# Patient Record
Sex: Male | Born: 2000 | Race: White | Hispanic: No | Marital: Married | Smoking: Current some day smoker
Health system: Southern US, Community
[De-identification: ages and names within clinical notes are randomized; demographics above are authoritative.]

## PROBLEM LIST (undated history)

## (undated) DIAGNOSIS — J189 Pneumonia, unspecified organism: Secondary | ICD-10-CM

## (undated) DIAGNOSIS — F509 Eating disorder, unspecified: Secondary | ICD-10-CM

## (undated) DIAGNOSIS — F419 Anxiety disorder, unspecified: Secondary | ICD-10-CM

## (undated) DIAGNOSIS — F32A Depression, unspecified: Secondary | ICD-10-CM

## (undated) DIAGNOSIS — G4700 Insomnia, unspecified: Secondary | ICD-10-CM

## (undated) DIAGNOSIS — IMO0002 Reserved for concepts with insufficient information to code with codable children: Secondary | ICD-10-CM

## (undated) DIAGNOSIS — F329 Major depressive disorder, single episode, unspecified: Secondary | ICD-10-CM

---
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
## 2019-09-01 IMAGING — DX DG ABDOMEN ACUTE W/ 1V CHEST
3 series · 3 of 3 positions shown · non-contrast
Comparison: None.

CLINICAL DATA: Acute onset of mid abdominal pain.

EXAM:
DG ABDOMEN ACUTE W/ 1V CHEST

[chest pa]
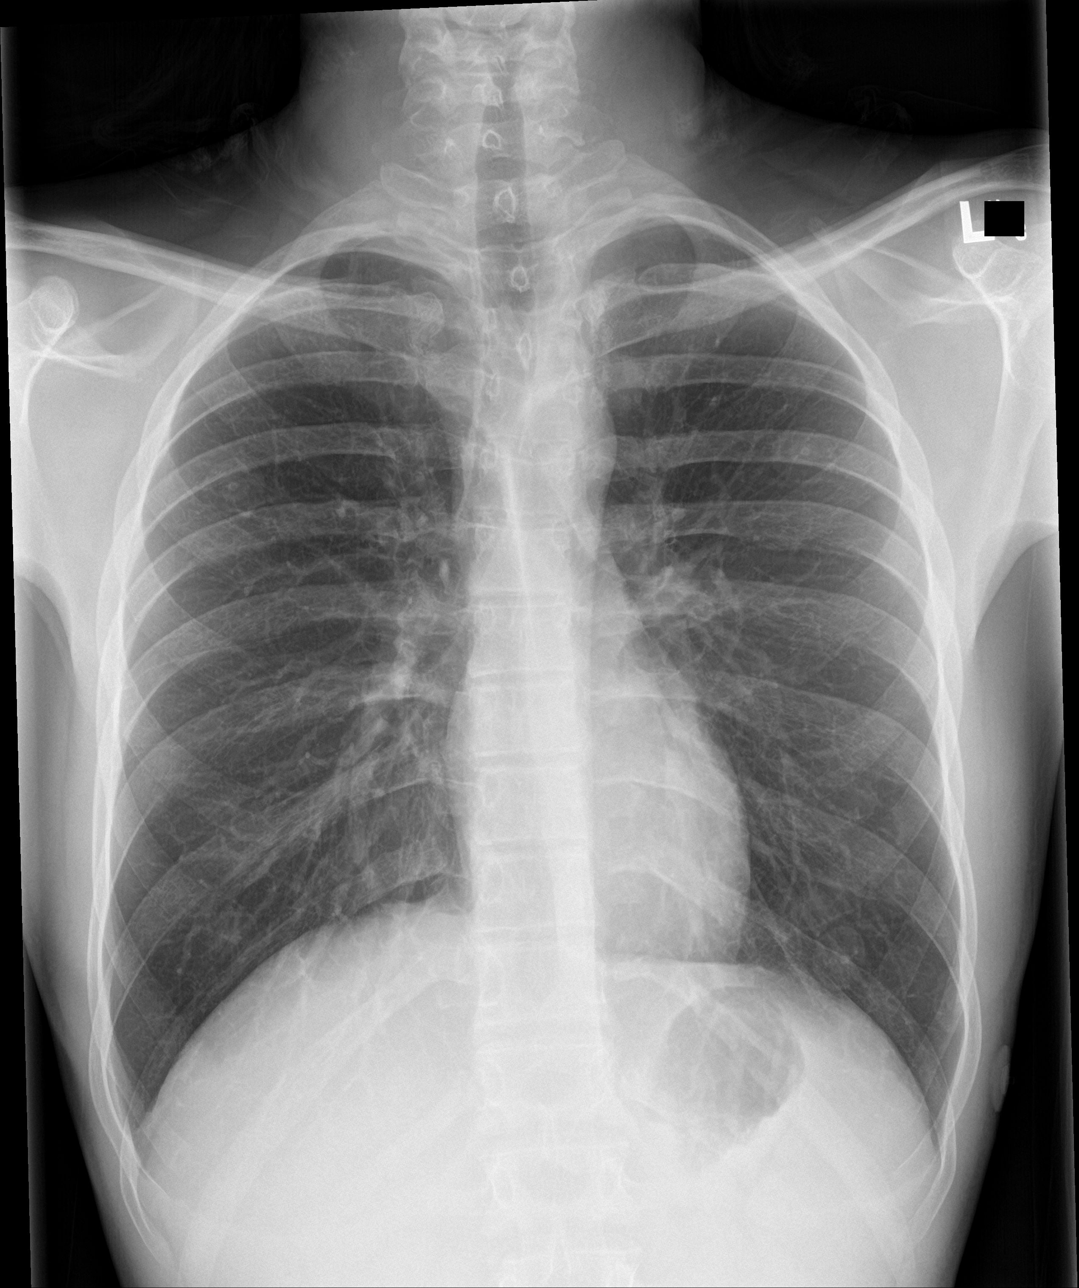

[abdomen erect]
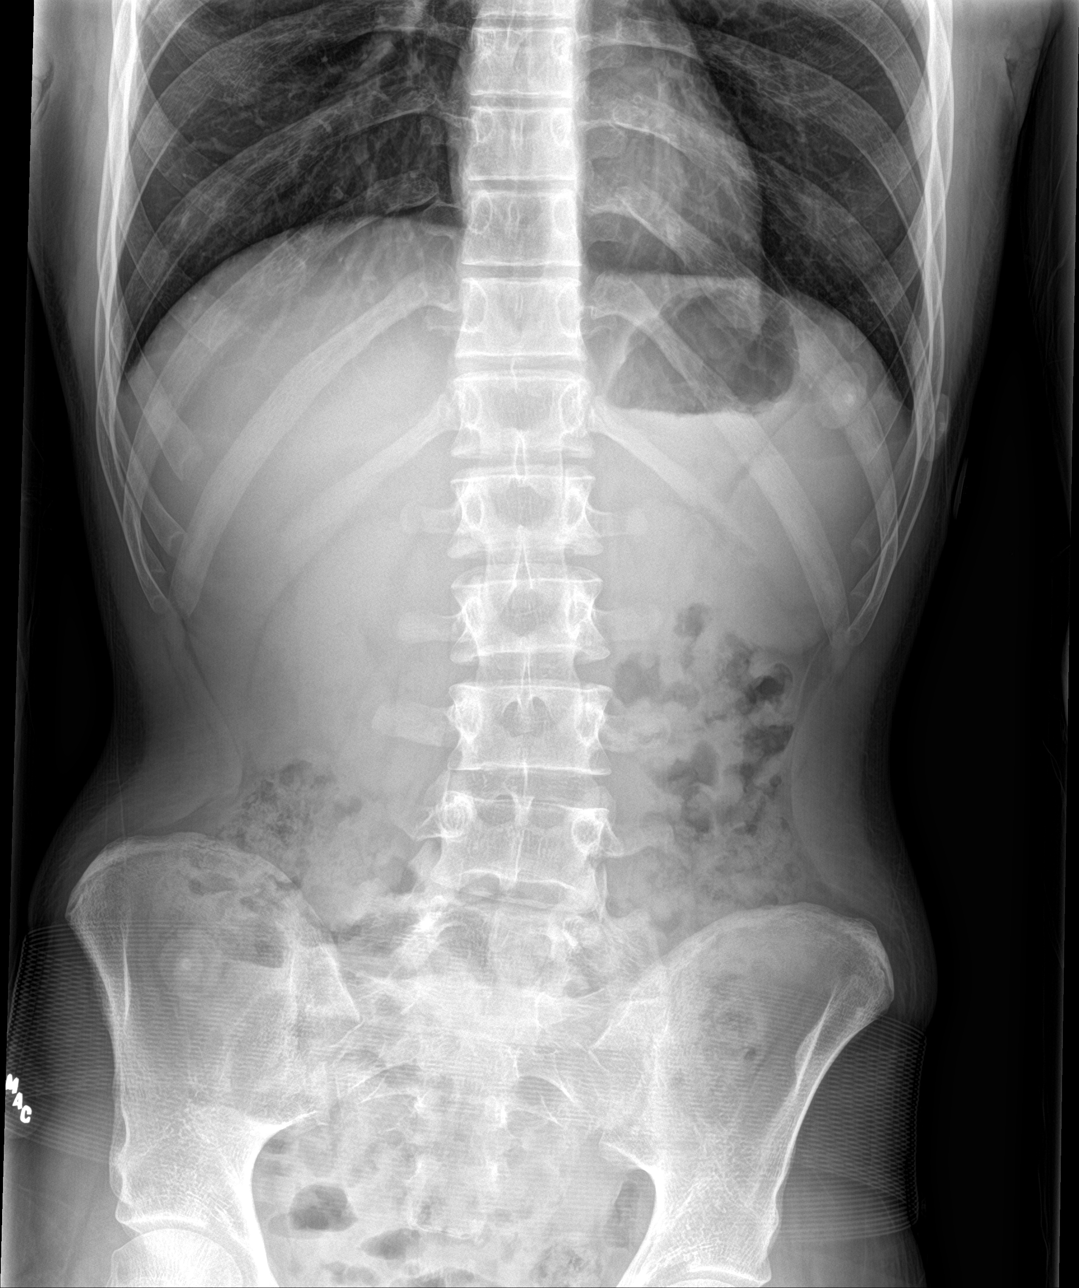

[abdomen supine]
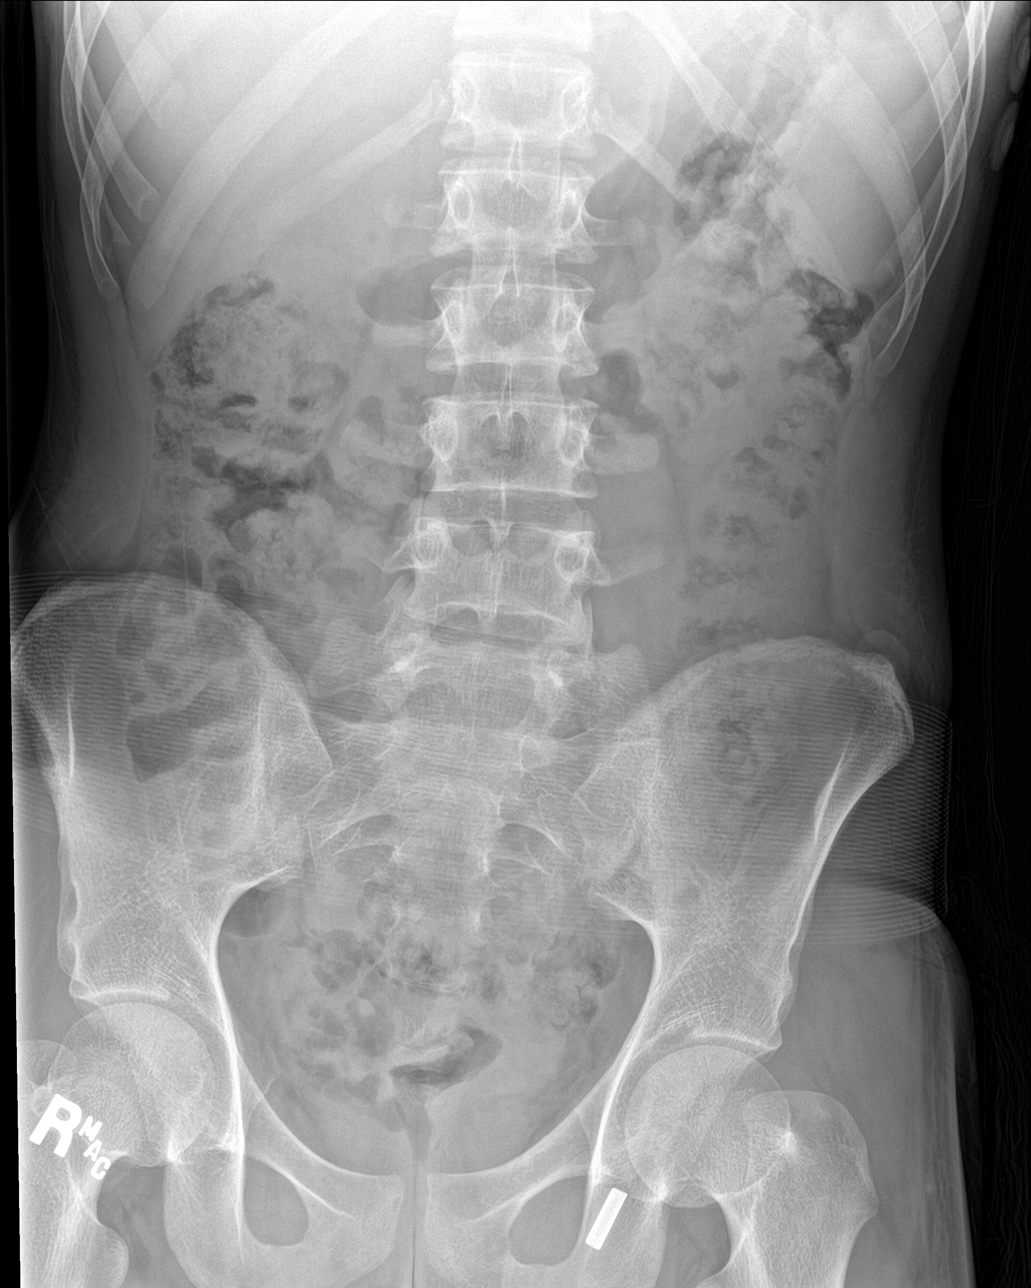

[3 of 3 positions shown; findings below may reference images not displayed]

FINDINGS: The lungs are well-aerated and clear. There is no evidence of focal
opacification, pleural effusion or pneumothorax. The
cardiomediastinal silhouette is within normal limits.

The visualized bowel gas pattern is unremarkable. Scattered stool
and air are seen within the colon; there is no evidence of small
bowel dilatation to suggest obstruction. No free intra-abdominal air
is identified on the provided upright view.

No acute osseous abnormalities are seen; the sacroiliac joints are
unremarkable in appearance.
IMPRESSION: 1. Unremarkable bowel gas pattern; no free intra-abdominal air seen.
Moderate amount of stool noted in the colon.
2. No acute cardiopulmonary process seen.

## 2020-07-18 ENCOUNTER — Other Ambulatory Visit: Payer: Self-pay

## 2020-07-18 ENCOUNTER — Emergency Department (HOSPITAL_BASED_OUTPATIENT_CLINIC_OR_DEPARTMENT_OTHER)
Admission: EM | Admit: 2020-07-18 | Discharge: 2020-07-18 | Disposition: A | Discharge status: Home / Self Care | Payer: Worker's Compensation | Attending: Emergency Medicine | Admitting: Emergency Medicine

## 2020-07-18 ENCOUNTER — Encounter (HOSPITAL_BASED_OUTPATIENT_CLINIC_OR_DEPARTMENT_OTHER): Payer: Self-pay | Admitting: *Deleted

## 2020-07-18 DIAGNOSIS — Y92090 Kitchen in other non-institutional residence as the place of occurrence of the external cause: Secondary | ICD-10-CM | POA: Insufficient documentation

## 2020-07-18 DIAGNOSIS — Y93G3 Activity, cooking and baking: Secondary | ICD-10-CM | POA: Diagnosis not present

## 2020-07-18 DIAGNOSIS — W260XXA Contact with knife, initial encounter: Secondary | ICD-10-CM | POA: Diagnosis not present

## 2020-07-18 DIAGNOSIS — S61315A Laceration without foreign body of left ring finger with damage to nail, initial encounter: Secondary | ICD-10-CM

## 2020-07-18 DIAGNOSIS — Z23 Encounter for immunization: Secondary | ICD-10-CM | POA: Diagnosis not present

## 2020-07-18 DIAGNOSIS — F17200 Nicotine dependence, unspecified, uncomplicated: Secondary | ICD-10-CM | POA: Diagnosis not present

## 2020-07-18 DIAGNOSIS — S61215A Laceration without foreign body of left ring finger without damage to nail, initial encounter: Secondary | ICD-10-CM | POA: Diagnosis present

## 2020-07-18 MED ORDER — LIDOCAINE HCL (PF) 1 % IJ SOLN
10.0000 mL | Freq: Once | INTRAMUSCULAR | Status: AC
  Administered 2020-07-18: 10 mL
  Filled 2020-07-18: qty 10

## 2020-07-18 MED ORDER — TETANUS-DIPHTH-ACELL PERTUSSIS 5-2.5-18.5 LF-MCG/0.5 IM SUSY
0.5000 mL | PREFILLED_SYRINGE | Freq: Once | INTRAMUSCULAR | Status: AC
  Administered 2020-07-18: 0.5 mL via INTRAMUSCULAR
  Filled 2020-07-18: qty 0.5

## 2020-07-18 NOTE — ED Triage Notes (Signed)
Left index finger lac by kitchen knife x 20 mins ago

## 2020-07-18 NOTE — Discharge Instructions (Signed)
Please read and follow all provided instructions.  Your diagnoses today include:  1. Laceration of left ring finger without foreign body with damage to nail, initial encounter     Tests performed today include:  Vital signs. See below for your results today.   Medications prescribed:   None  Take any prescribed medications only as directed.   Home care instructions:  Follow any educational materials and wound care instructions contained in this packet.   Keep affected area above the level of your heart when possible to minimize swelling. Wash area gently twice a day with warm soapy water. Do not apply alcohol or hydrogen peroxide. Cover the area if it draining or weeping.   Follow-up instructions: Suture Removal: Return to the Emergency Department or see your primary care care doctor in 10 days for a recheck of your wound and removal of your sutures or staples.    Return instructions:  Return to the Emergency Department if you have:  Fever  Worsening pain  Worsening swelling of the wound  Pus draining from the wound  Redness of the skin that moves away from the wound, especially if it streaks away from the affected area   Any other emergent concerns  Your vital signs today were: BP (!) 119/47   Pulse 68   Temp 98.3 F (36.8 C) (Oral)   Resp 16   Ht 5\' 9"  (1.753 m)   Wt 59 kg   SpO2 99%   BMI 19.20 kg/m  If your blood pressure (BP) was elevated above 135/85 this visit, please have this repeated by your doctor within one month. --------------

## 2020-07-18 NOTE — ED Notes (Signed)
Laceration to left ring finger, cut with a knife while slicing , bleeding controlled.

## 2020-07-18 NOTE — ED Provider Notes (Signed)
[facility] EMERGENCY DEPARTMENT Provider Note   CSN: [id number] Arrival date & time: 07/18/20  1628     History Chief Complaint  Patient presents with  . Laceration    Jerome Collins is a 20 y.o. male.  Patient presents to the emergency department for evaluation of a laceration sustained just prior to arrival.  Patient was at work cutting chicken with a knife when he cut the dorsal aspect of his left index finger, distal to the DIP joint.  He applied a paper towel.  No other treatments prior to arrival.  He reports pain in the area, worse with movement.  No numbness or tingling reported.  No other injuries.  Last tetanus booster in childhood.        Past Medical History:  Diagnosis Date  . Anxiety    copes by being more social  . Depression   . Eating disorder    anorexia, bulemia  . Pneumonia    18 months of age  . Self-harm    last thought of self harm 2 days ago r/t eating, no being able to purge, step dad    Patient Active Problem List   Diagnosis Date Noted  . Eating disorder 11/04/2015  . Dizziness 11/04/2015  . 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 No pertinent surgical history.     Family History  Problem Relation Age of Onset  . Mental illness Mother        depression, anxiety  . Cancer Maternal Grandmother        leukemia, breast  . Cancer Paternal Grandfather        prostate    Social History   Tobacco Use  . Smoking status: Current Some Day Smoker  . Smokeless tobacco: Never Used  . Tobacco comment: vapes - no nicotine - every other day  Substance Use Topics  . Alcohol use: Yes    Comment: mikes hard lemonade w/ shot of vodka  . Drug use: Yes    Comment: LSD    Home Medications Prior to Admission medications   Medication Sig Start Date End Date Taking? Authorizing Provider  polyethylene glycol powder (MIRALAX) powder Take 17 g by mouth daily. 06/30/17    Palumbo, April, MD    Allergies    Patient has no known allergies.  Review of Systems   Review of Systems  Constitutional: Negative for activity change.  Musculoskeletal: Negative for gait problem and joint swelling.  Skin: Positive for wound.  Neurological: Negative for weakness and numbness.    Physical Exam Updated Vital Signs BP (!) 119/47   Pulse 68   Temp 98.3 F (36.8 C) (Oral)   Resp 16   Ht 5\' 9"  (1.753 m)   Wt 59 kg   SpO2 99%   BMI 19.20 kg/m   Physical Exam Vitals and nursing note reviewed.  Constitutional:      Appearance: He is well-developed.  HENT:     Head: Normocephalic and atraumatic.  Eyes:     Conjunctiva/sclera: Conjunctivae normal.  Pulmonary:     Effort: No respiratory distress.  Musculoskeletal:     Cervical back: Normal range of motion and neck supple.     Comments: Left index finger: Full active extension at the DIP, PIP, MCP joints.  Normal range of motion of the hand and patient is able to make a fist without difficulty.  Skin:    General: Skin is warm and dry.  Comments: L index finger: There is a 1cm laceration, dorsal aspect, couple millimeters proximal to the nail fold.  Wound base explored with tourniquet in place.  I can visualize the proximal end of the nail plate.  The wound extends to approximately depth of nail plate.  I do not visualize any foreign bodies or tendon injury.  Wound base is clean.  Mild venous oozing prior to tourniquet placement.  Neurological:     Mental Status: He is alert.     ED Results / Procedures / Treatments   Labs (all labs ordered are listed, but only abnormal results are displayed) Labs Reviewed - No data to display  EKG None  Radiology No results found.  Procedures .Marland KitchenLaceration Repair  Date/Time: 07/18/2020 6:02 PM Performed by: Renne Crigler, PA-C Authorized by: Renne Crigler, PA-C   Consent:    Consent obtained:  Verbal   Consent given by:  Patient   Risks discussed:   Infection, pain, tendon damage and poor cosmetic result   Alternatives discussed:  No treatment Universal protocol:    Patient identity confirmed:  Verbally with patient, arm band and provided demographic data Anesthesia:    Anesthesia method:  Local infiltration   Local anesthetic:  Lidocaine 1% w/o epi Laceration details:    Location:  Finger   Finger location:  L ring finger   Length (cm):  1 Pre-procedure details:    Preparation:  Patient was prepped and draped in usual sterile fashion Exploration:    Wound exploration: wound explored through full range of motion and entire depth of wound visualized     Wound extent: no foreign bodies/material noted and no tendon damage noted     Contaminated: no   Treatment:    Irrigation solution:  Sterile saline   Irrigation volume:  500cc   Irrigation method:  Pressure wash   Debridement:  None Skin repair:    Repair method:  Sutures   Suture size:  5-0   Suture material:  Nylon   Suture technique:  Simple interrupted   Number of sutures:  3 Approximation:    Approximation:  Close Post-procedure details:    Dressing:  Open (no dressing)   Procedure completion:  Tolerated well, no immediate complications     Medications Ordered in ED Medications  lidocaine (PF) (XYLOCAINE) 1 % injection 10 mL (has no administration in time range)  Tdap (BOOSTRIX) injection 0.5 mL (has no administration in time range)    ED Course  I have reviewed the triage vital signs and the nursing notes.  Pertinent labs & imaging results that were available during my care of the patient were reviewed by me and considered in my medical decision making (see chart for details).    MDM Rules/Calculators/A&P                          Finger laceration, adequately cleaned and repaired.  Do not suspect foreign body.  I suspect bony injury.  Do not suspect tendon injury.  Distal CMS intact.  Tetanus updated.  Do not feel that patient needs antibiotics.   Final  Clinical Impression(s) / ED Diagnoses Final diagnoses:  Laceration of left ring finger without foreign body with damage to nail, initial encounter    Rx / DC Orders ED Discharge Orders    None       Renne Crigler, PA-C 07/18/20 1803    Derwood Kaplan, MD 07/19/20 1646

## 2020-07-30 ENCOUNTER — Emergency Department (HOSPITAL_BASED_OUTPATIENT_CLINIC_OR_DEPARTMENT_OTHER)
Admission: EM | Admit: 2020-07-30 | Discharge: 2020-07-30 | Disposition: A | Discharge status: Home / Self Care | Payer: Worker's Compensation | Attending: Emergency Medicine | Admitting: Emergency Medicine

## 2020-07-30 ENCOUNTER — Other Ambulatory Visit: Payer: Self-pay

## 2020-07-30 ENCOUNTER — Encounter (HOSPITAL_BASED_OUTPATIENT_CLINIC_OR_DEPARTMENT_OTHER): Payer: Self-pay

## 2020-07-30 DIAGNOSIS — Z4802 Encounter for removal of sutures: Secondary | ICD-10-CM | POA: Insufficient documentation

## 2020-07-30 DIAGNOSIS — F17200 Nicotine dependence, unspecified, uncomplicated: Secondary | ICD-10-CM | POA: Diagnosis not present

## 2020-07-30 NOTE — ED Provider Notes (Signed)
[facility] EMERGENCY DEPARTMENT Provider Note   CSN: [id number] Arrival date & time: 07/30/20  1829     History Chief Complaint  Patient presents with  . Suture / Staple Removal    Jerome Collins is a 20 y.o. male.  20 year old male presents for suture removal from finger placed 12 days ago, healing without concerns.        Past Medical History:  Diagnosis Date  . Anxiety    copes by being more social  . Depression   . Eating disorder    anorexia, bulemia  . Pneumonia    49 months of age  . Self-harm    last thought of self harm 2 days ago r/t eating, no being able to purge, step dad    Patient Active Problem List   Diagnosis Date Noted  . Eating disorder 11/04/2015  . Dizziness 11/04/2015  . 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 No pertinent surgical history.     Family History  Problem Relation Age of Onset  . Mental illness Mother        depression, anxiety  . Cancer Maternal Grandmother        leukemia, breast  . Cancer Paternal Grandfather        prostate    Social History   Tobacco Use  . Smoking status: Current Some Day Smoker  . Smokeless tobacco: Never Used  . Tobacco comment: vapes - no nicotine - every other day  Substance Use Topics  . Alcohol use: Yes    Comment: mikes hard lemonade w/ shot of vodka  . Drug use: Not Currently    Comment: LSD    Home Medications Prior to Admission medications   Medication Sig Start Date End Date Taking? Authorizing Provider  polyethylene glycol powder (MIRALAX) powder Take 17 g by mouth daily. 06/30/17   Palumbo, April, MD    Allergies    Patient has no known allergies.  Review of Systems   Review of Systems  Constitutional: Negative for fever.  Musculoskeletal: Negative for arthralgias and myalgias.  Skin: Positive for wound. Negative for color change.  Allergic/Immunologic: Negative for immunocompromised  state.  Neurological: Negative for weakness and numbness.    Physical Exam Updated Vital Signs BP (!) 120/52 (BP Location: Left Arm)   Pulse 68   Temp 98.5 F (36.9 C) (Oral)   Resp 15   Ht 5\' 9"  (1.753 m)   Wt 61.2 kg   SpO2 100%   BMI 19.94 kg/m   Physical Exam Vitals and nursing note reviewed.  Constitutional:      General: He is not in acute distress.    Appearance: He is well-developed. He is not diaphoretic.  HENT:     Head: Normocephalic and atraumatic.  Pulmonary:     Effort: Pulmonary effort is normal.  Skin:    General: Skin is warm and dry.     Findings: No erythema or rash.     Comments: Sutures in place, wound appears to be well-healed, no concerns for infection.  Neurological:     Mental Status: He is alert and oriented to person, place, and time.  Psychiatric:        Behavior: Behavior normal.     ED Results / Procedures / Treatments   Labs (all labs ordered are listed, but only abnormal results are displayed) Labs Reviewed - No data to display  EKG None  Radiology No results found.  Procedures Procedures   Medications Ordered in ED Medications - No data to display  ED Course  I have reviewed the triage vital signs and the nursing notes.  Pertinent labs & imaging results that were available during my care of the patient were reviewed by me and considered in my medical decision making (see chart for details).  Clinical Course as of 07/30/20 1101  Tue Jul 30, 2020  [id number] 20 year old male presents for suture removal placed 12 days ago.  Wound healing without concern for infection.  Plan is for sutures to be removed and patient to be discharged. [LM]    Clinical Course User Index [LM] Alden Hipp   MDM Rules/Calculators/A&P                          Final Clinical Impression(s) / ED Diagnoses Final diagnoses:  Visit for suture removal    Rx / DC Orders ED Discharge Orders    None       Jeannie Fend, PA-C 07/30/20  1101    Terald Sleeper, MD 07/30/20 1816

## 2020-07-30 NOTE — ED Triage Notes (Signed)
Pt arrives here to have stitches removed

## 2020-07-30 NOTE — ED Notes (Signed)
3 sutures removed from finger.  

## 2022-05-26 ENCOUNTER — Emergency Department (HOSPITAL_BASED_OUTPATIENT_CLINIC_OR_DEPARTMENT_OTHER)
Admission: EM | Admit: 2022-05-26 | Discharge: 2022-05-26 | Disposition: A | Discharge status: Home / Self Care | Payer: Medicaid Other | Attending: Emergency Medicine | Admitting: Emergency Medicine

## 2022-05-26 ENCOUNTER — Encounter (HOSPITAL_BASED_OUTPATIENT_CLINIC_OR_DEPARTMENT_OTHER): Payer: Self-pay

## 2022-05-26 ENCOUNTER — Other Ambulatory Visit: Payer: Self-pay

## 2022-05-26 DIAGNOSIS — R1084 Generalized abdominal pain: Secondary | ICD-10-CM | POA: Insufficient documentation

## 2022-05-26 DIAGNOSIS — R112 Nausea with vomiting, unspecified: Secondary | ICD-10-CM | POA: Insufficient documentation

## 2022-05-26 DIAGNOSIS — R197 Diarrhea, unspecified: Secondary | ICD-10-CM | POA: Insufficient documentation

## 2022-05-26 HISTORY — DX: Insomnia, unspecified: G47.00

## 2022-05-26 LAB — CBC
HCT: 43.3 % (ref 39.0–52.0)
Hemoglobin: 14.7 g/dL (ref 13.0–17.0)
MCH: 31.2 pg (ref 26.0–34.0)
MCHC: 33.9 g/dL (ref 30.0–36.0)
MCV: 91.9 fL (ref 80.0–100.0)
Platelets: 164 10*3/uL (ref 150–400)
RBC: 4.71 MIL/uL (ref 4.22–5.81)
RDW: 11.9 % (ref 11.5–15.5)
WBC: 8.5 10*3/uL (ref 4.0–10.5)
nRBC: 0 % (ref 0.0–0.2)

## 2022-05-26 LAB — COMPREHENSIVE METABOLIC PANEL
ALT: 24 U/L (ref 0–44)
AST: 26 U/L (ref 15–41)
Albumin: 4.3 g/dL (ref 3.5–5.0)
Alkaline Phosphatase: 44 U/L (ref 38–126)
Anion gap: 6 (ref 5–15)
BUN: 16 mg/dL (ref 6–20)
CO2: 26 mmol/L (ref 22–32)
Calcium: 8.9 mg/dL (ref 8.9–10.3)
Chloride: 103 mmol/L (ref 98–111)
Creatinine, Ser: 0.82 mg/dL (ref 0.61–1.24)
GFR, Estimated: >60 mL/min (ref >60)
Glucose, Bld: 115 mg/dL — ABNORMAL HIGH (ref 70–99)
Potassium: 3.8 mmol/L (ref 3.5–5.1)
Sodium: 135 mmol/L (ref 135–145)
Total Bilirubin: 1.8 mg/dL — ABNORMAL HIGH (ref 0.3–1.2)
Total Protein: 7.3 g/dL (ref 6.5–8.1)

## 2022-05-26 LAB — LIPASE, BLOOD: Lipase: 25 U/L (ref 11–51)

## 2022-05-26 MED ORDER — METOCLOPRAMIDE HCL 5 MG/ML IJ SOLN
10.0000 mg | Freq: Once | INTRAMUSCULAR | Status: AC
  Administered 2022-05-26: 10 mg via INTRAMUSCULAR
  Filled 2022-05-26: qty 2

## 2022-05-26 MED ORDER — ONDANSETRON 4 MG PO TBDP: 4.0000 mg | ORAL_TABLET | Freq: Three times a day (TID) | ORAL | 0 refills | Status: DC | PRN

## 2022-05-26 MED ORDER — ONDANSETRON HCL 4 MG/2ML IJ SOLN: 4.0000 mg | Freq: Once | INTRAMUSCULAR | Status: DC | PRN

## 2022-05-26 NOTE — ED Triage Notes (Signed)
Pt reports smoking marijuana last night prior to going to sleep. Pt also reports eating chinese food last night and is concerned he may have food poisoning.

## 2022-05-26 NOTE — ED Provider Notes (Signed)
[place] [facility] Provider Note   CSN: [id number] Arrival date & time: 05/26/22  0537     History  Chief Complaint  Patient presents with   Abdominal Pain    Jerome Collins is a 22 y.o. male.  The history is provided by the patient. No language interpreter was used.  Abdominal Pain Pain location:  Generalized Pain quality: aching   Pain radiates to:  Does not radiate Pain severity:  Moderate Onset quality:  Gradual Duration:  2 days Timing:  Constant Progression:  Worsening Context: not recent illness   Relieved by:  Nothing Ineffective treatments:  None tried Associated symptoms: no nausea        Home Medications Prior to Admission medications   Medication Sig Start Date End Date Taking? Authorizing Provider  polyethylene glycol powder (MIRALAX) powder Take 17 g by mouth daily. 06/30/17   Palumbo, April, MD      Allergies    Patient has no known allergies.    Review of Systems   Review of Systems  Gastrointestinal:  Positive for abdominal pain. Negative for nausea.  All other systems reviewed and are negative.   Physical Exam Updated Vital Signs BP (!) 110/58 (BP Location: Right Arm)   Pulse 63   Temp 98 F (36.7 C) (Oral)   Resp 20   Ht 5' 8.5" (1.74 m)   Wt 61.2 kg   SpO2 100%   BMI 20.23 kg/m  Physical Exam Vitals and nursing note reviewed.  Constitutional:      Appearance: He is well-developed.  HENT:     Head: Normocephalic.  Cardiovascular:     Rate and Rhythm: Normal rate and regular rhythm.  Pulmonary:     Effort: Pulmonary effort is normal.  Abdominal:     General: Abdomen is flat. Bowel sounds are normal. There is no distension.  Musculoskeletal:        General: Normal range of motion.     Cervical back: Normal range of motion.  Skin:    General: Skin is warm.  Neurological:     Mental Status: He is alert and oriented to person, place, and time.     ED Results / Procedures /  Treatments   Labs (all labs ordered are listed, but only abnormal results are displayed) Labs Reviewed  COMPREHENSIVE METABOLIC PANEL - Abnormal; Notable for the following components:      Result Value   Glucose, Bld 115 (*)    Total Bilirubin 1.8 (*)    All other components within normal limits  LIPASE, BLOOD  CBC  URINALYSIS, ROUTINE W REFLEX MICROSCOPIC    EKG None  Radiology No results found.  Procedures Procedures    Medications Ordered in ED Medications  ondansetron (ZOFRAN) injection 4 mg (has no administration in time range)  metoCLOPramide (REGLAN) injection 10 mg (10 mg Intramuscular Given 05/26/22 0608)    ED Course/ Medical Decision Making/ A&P                             Medical Decision Making Pt complains of vomiting, diarrhea and abdominal cramping after eating chinese food   Amount and/or Complexity of Data Reviewed Independent Historian: friend Labs: ordered. Decision-making details documented in ED Course.    Details: Labs ordered reviewed and interpreted.   Bili 1.8    Risk Prescription drug management. Risk Details: Pt feels better after IV nausea medications.  I suspect viral  illness or possible food related illness Pt given rx for zofran.  Pt advised to recheck if symptoses worsen    MDM:          Final Clinical Impression(s) / ED Diagnoses Final diagnoses:  Nausea vomiting and diarrhea    Rx / DC Orders ED Discharge Orders          Ordered    ondansetron (ZOFRAN-ODT) 4 MG disintegrating tablet  Every 8 hours PRN        05/26/22 1025          An After Visit Summary was printed and given to the patient.     Fransico Meadow, [place] 05/26/22 1029    Regan Lemming, MD 05/26/22 1034

## 2022-05-26 NOTE — ED Notes (Signed)
Patient given a ginger ale at this time.

## 2022-05-26 NOTE — ED Triage Notes (Signed)
Upper abdominal pain woke patient up from sleep at 2330. Pain has come in waves. Pt reports N/V/D as well. Pt felt normal prior to going to bed. Denies fever. Pt admits to SOB, weakness, and fatigue.

## 2022-05-26 NOTE — ED Notes (Signed)
Discharge instructions reviewed with patient. Patient verbalizes understanding, no further questions at this time. Medications/prescriptions and follow up information provided. No acute distress noted at time of departure.  

## 2022-05-26 NOTE — Discharge Instructions (Signed)
Return if any problems.

## 2023-05-27 ENCOUNTER — Encounter: Payer: Self-pay | Admitting: Podiatry

## 2023-05-27 ENCOUNTER — Ambulatory Visit (INDEPENDENT_AMBULATORY_CARE_PROVIDER_SITE_OTHER): Admitting: Podiatry

## 2023-05-27 ENCOUNTER — Ambulatory Visit (INDEPENDENT_AMBULATORY_CARE_PROVIDER_SITE_OTHER)

## 2023-05-27 DIAGNOSIS — M7741 Metatarsalgia, right foot: Secondary | ICD-10-CM | POA: Diagnosis not present

## 2023-05-27 DIAGNOSIS — Q6672 Congenital pes cavus, left foot: Secondary | ICD-10-CM

## 2023-05-27 DIAGNOSIS — M79671 Pain in right foot: Secondary | ICD-10-CM | POA: Diagnosis not present

## 2023-05-27 DIAGNOSIS — M7742 Metatarsalgia, left foot: Secondary | ICD-10-CM

## 2023-05-27 DIAGNOSIS — M62461 Contracture of muscle, right lower leg: Secondary | ICD-10-CM

## 2023-05-27 DIAGNOSIS — Q6671 Congenital pes cavus, right foot: Secondary | ICD-10-CM | POA: Diagnosis not present

## 2023-05-27 DIAGNOSIS — M79672 Pain in left foot: Secondary | ICD-10-CM | POA: Diagnosis not present

## 2023-05-27 DIAGNOSIS — M62462 Contracture of muscle, left lower leg: Secondary | ICD-10-CM

## 2023-05-27 MED ORDER — MELOXICAM 15 MG PO TABS: 15.0000 mg | ORAL_TABLET | Freq: Every day | ORAL | 3 refills | Status: AC

## 2023-05-27 NOTE — Patient Instructions (Signed)
Do exercises exactly as told by your health care provider and adjust them as directed. It is normal to feel mild stretching, pulling, tightness, or discomfort as you do these exercises. Stop the exercise right away if you feel sudden pain or your pain gets worse.   Stretching exercises These exercises improve the movement and flexibility of your calf muscles. These exercises may also help to relieve pain and stiffness. Standing gastroc stretch  This exercise is also called a standing calf (gastroc) stretch. Stand with your hands against a wall. Extend your left / right leg behind you, and bend your front knee slightly. Your heels should be on the floor. Keeping your heels on the floor and your back knee straight, shift your weight toward the wall. You should feel a gentle stretch in the back of your lower leg (calf). Hold this position for 10 seconds. Repeat 10 times. Complete this exercise 2 times a day. Gastroc and soleus stretch, standing This is an exercise in which you stand on a step and use your body weight to stretch your calf muscles. To do this exercise: Stand with the ball of your left / right foot on a step. The ball of your foot is on the walking surface, right under your toes. Keep your other foot firmly on the same step. Hold on to the wall, a railing, or a chair for balance. Slowly lift your other foot, allowing your body weight to press your left / right heel down over the edge of the step. You should feel a stretch in your left / right calf. Hold this position for 10 seconds. Return both feet to the step. Repeat this exercise with a slight bend in your left / right knee. Repeat 10 times. Complete this exercise 2 times a day. Strengthening exercise This exercise builds strength and endurance in your foot muscles and may help to take pressure off your heel. Endurance is the ability to use your muscles for a long time, even after they get tired. Arch lifts This exercise is  sometimes called foot intrinsics. This is an exercise in which you lift the arch part of your foot only. To do this exercise: Sit in a chair with your feet flat on the floor. Keeping your big toe and your heel on the floor, lift only your arch, which is on the inner edge of your left / right foot. Do not move your knee or scrunch your toes. This is a small movement. Hold this position for 10 seconds. Return to the starting position. Repeat 10 times. Complete this exercise 2 times a day.  

## 2023-05-27 NOTE — Progress Notes (Signed)
 Subjective:  Patient ID: Jerome Collins, male    DOB: 01-10-2001,  MRN: [id number]  Chief Complaint  Patient presents with   Foot Pain    Patient states that he works a lot on his feet and he has been having foot pain in bilateral feet, patient also states that his bilateral arches are higher then normal and would like to know about getting inserts.     Discussed the use of AI scribe software for clinical note transcription with the patient, who gave verbal consent to proceed.  History of Present Illness   The patient presents with foot pain.  He experiences foot pain primarily on the sides of his feet, with no recent injuries, falls, or sprained ankles. The pain is associated with his work activities, which involve significant physical exertion, including jogging and sprinting for six to eight hours a day.  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 This physical activity contributes to the soreness in his ankles, particularly where the Achilles tendon meets the heel bone, although he describes the discomfort as slight.  He wears [employer] steel-toed running shoes for work. These shoes may contribute to the discomfort he experiences at the end of the day, especially in the ankles.  For pain relief, he has tried Tylenol and ibuprofen. Tylenol has not been effective, while ibuprofen provides some relief. He takes ibuprofen regularly when needed.  No pain upon pressing on the foot, except for slight discomfort in certain areas. Soreness in the ankles, particularly at the end of the day, but no major pain.          Objective:    Physical Exam   VASCULAR: DP and PT pulse palpable. Foot is warm and well-perfused. Capillary fill time is brisk. DERMATOLOGIC: Normal skin turgor, texture, and temperature. No open lesions, rashes, or ulcerations. NEUROLOGIC: Normal sensation to light touch and pressure. No paresthesias on  examination. ORTHOPEDIC: Significant gastrocnemius equinus, pes cavus foot type, mild diffuse tenderness across metatarsal heads. No evidence of fracture, stress fracture, or capsulitis. Smooth pain-free range of motion of all examined joints. No ecchymosis or bruising. No gross deformity.       No images are attached to the encounter.    Results    Radiographs of both feet taken today show no fracture or stress fracture but has pes cavus foot type     Assessment:   1. Metatarsalgia of both feet   2. Pes cavus of both feet   3. Gastrocnemius equinus of left lower extremity   4. Gastrocnemius equinus of right lower extremity      Plan:  Patient was evaluated and treated and all questions answered.  Assessment and Plan    Metatarsalgia Metatarsalgia due to pes cavus and occupational demands, causing pain in the ball of the foot. No evidence of stress fractures or capsulitis. High arch foot type increases pressure on the metatarsal heads. - Refer to orthotist for custom orthotics fitting to provide adequate support for high arches and redistribute pressure away from the metatarsal heads. - Prescribe meloxicam for pain management, instruct to take once daily with food and water to minimize gastrointestinal side effects. - Advise calf stretching exercises twice daily to alleviate pressure on the forefoot. - Instruct to bring work shoes to orthotics fitting appointment to ensure proper fit and avoid excessive toe pressure.  Pes cavus High arch foot type contributing to metatarsalgia due to inadequate arch support from  current footwear.  Gastrocnemius equinus Tight calf muscles contributing to increased forefoot pressure, exacerbating metatarsalgia symptoms.  Follow-up Follow-up required to ensure proper fitting and effectiveness of orthotics. Orthotics typically last 3-5 years, and adjustments can be made within three months of fitting if needed. - Schedule follow-up appointment  with orthotist to pick up and adjust orthotics as needed. - Instruct to monitor for any discomfort or improper fit of orthotics and report back for adjustments within three months.          No follow-ups on file.

## 2023-07-05 ENCOUNTER — Other Ambulatory Visit

## 2023-07-05 ENCOUNTER — Ambulatory Visit

## 2023-07-05 DIAGNOSIS — Q6671 Congenital pes cavus, right foot: Secondary | ICD-10-CM

## 2023-07-05 DIAGNOSIS — M7741 Metatarsalgia, right foot: Secondary | ICD-10-CM

## 2023-07-05 NOTE — Progress Notes (Signed)
 Orthotics   Patient was present and evaluated for Custom molded foot orthotics. Patient will benefit from CFO's to provide total contact to BIL MLA's helping to balance and distribute body weight more evenly across BIL feet helping to reduce plantar pressure and pain. Orthotic will also encourage FF / RF alignment  Patient was scanned today and will return for fitting upon receipt   Auth needed if over $500 will send for prior auth / Coins is $60 for DME does not apply to ded or oop for year  Ref call# [id number]  Britton Cane Cped, CFo, CFm

## 2023-08-11 NOTE — Progress Notes (Signed)
 Prior auth approval received Auth number [id number] valid from 07/05/23 until 10/04/2023  Will owe $60

## 2023-08-17 NOTE — Progress Notes (Signed)
 Order placed and scans sent to [facility]

## 2023-08-30 ENCOUNTER — Telehealth: Payer: Self-pay

## 2023-08-30 NOTE — Telephone Encounter (Signed)
 Called pt to schedule orthotic PU.. pt said he will call us  back to schedule when he is not busy

## 2023-09-22 ENCOUNTER — Other Ambulatory Visit

## 2023-09-23 ENCOUNTER — Ambulatory Visit (INDEPENDENT_AMBULATORY_CARE_PROVIDER_SITE_OTHER)

## 2023-09-23 DIAGNOSIS — Q6671 Congenital pes cavus, right foot: Secondary | ICD-10-CM | POA: Diagnosis not present

## 2023-09-23 DIAGNOSIS — M7742 Metatarsalgia, left foot: Secondary | ICD-10-CM

## 2023-09-23 DIAGNOSIS — Q6672 Congenital pes cavus, left foot: Secondary | ICD-10-CM | POA: Diagnosis not present

## 2023-09-23 DIAGNOSIS — M7741 Metatarsalgia, right foot: Secondary | ICD-10-CM | POA: Diagnosis not present

## 2023-09-23 NOTE — Progress Notes (Signed)
 Patient presents today to pick up custom molded foot orthotics, diagnosed with pes cavus and metatarsalgia BIL by Dr. Silva .   Orthotics were dispensed and fit was satisfactory. Reviewed instructions for break-in and wear. Written instructions given to patient.  Patient will follow up as needed.   Lolita Schultze Cped, CFo, CFm

## 2024-03-16 ENCOUNTER — Other Ambulatory Visit: Payer: Self-pay

## 2024-03-16 ENCOUNTER — Encounter (HOSPITAL_COMMUNITY): Payer: Self-pay | Admitting: Emergency Medicine

## 2024-03-16 ENCOUNTER — Emergency Department (HOSPITAL_BASED_OUTPATIENT_CLINIC_OR_DEPARTMENT_OTHER)
Admission: EM | Admit: 2024-03-16 | Discharge: 2024-03-16 | Disposition: A | Discharge status: Home / Self Care | Attending: Emergency Medicine | Admitting: Emergency Medicine

## 2024-03-16 ENCOUNTER — Encounter (HOSPITAL_BASED_OUTPATIENT_CLINIC_OR_DEPARTMENT_OTHER): Payer: Self-pay

## 2024-03-16 ENCOUNTER — Encounter (HOSPITAL_COMMUNITY): Payer: Self-pay

## 2024-03-16 ENCOUNTER — Emergency Department (HOSPITAL_BASED_OUTPATIENT_CLINIC_OR_DEPARTMENT_OTHER): Admitting: Radiology

## 2024-03-16 ENCOUNTER — Emergency Department (HOSPITAL_COMMUNITY)
Admission: EM | Admit: 2024-03-16 | Discharge: 2024-03-16 | Discharge status: Against Medical Advice | Attending: Emergency Medicine | Admitting: Emergency Medicine

## 2024-03-16 ENCOUNTER — Ambulatory Visit (HOSPITAL_COMMUNITY): Admission: EM | Admit: 2024-03-16 | Discharge: 2024-03-16 | Disposition: A | Discharge status: Home / Self Care

## 2024-03-16 DIAGNOSIS — Z20828 Contact with and (suspected) exposure to other viral communicable diseases: Secondary | ICD-10-CM | POA: Diagnosis not present

## 2024-03-16 DIAGNOSIS — F17200 Nicotine dependence, unspecified, uncomplicated: Secondary | ICD-10-CM | POA: Insufficient documentation

## 2024-03-16 DIAGNOSIS — J181 Lobar pneumonia, unspecified organism: Secondary | ICD-10-CM | POA: Insufficient documentation

## 2024-03-16 DIAGNOSIS — J101 Influenza due to other identified influenza virus with other respiratory manifestations: Secondary | ICD-10-CM | POA: Insufficient documentation

## 2024-03-16 DIAGNOSIS — J189 Pneumonia, unspecified organism: Secondary | ICD-10-CM

## 2024-03-16 DIAGNOSIS — D696 Thrombocytopenia, unspecified: Secondary | ICD-10-CM

## 2024-03-16 DIAGNOSIS — D72819 Decreased white blood cell count, unspecified: Secondary | ICD-10-CM

## 2024-03-16 DIAGNOSIS — Z5321 Procedure and treatment not carried out due to patient leaving prior to being seen by health care provider: Secondary | ICD-10-CM | POA: Insufficient documentation

## 2024-03-16 DIAGNOSIS — R112 Nausea with vomiting, unspecified: Secondary | ICD-10-CM | POA: Diagnosis present

## 2024-03-16 DIAGNOSIS — Z79899 Other long term (current) drug therapy: Secondary | ICD-10-CM | POA: Insufficient documentation

## 2024-03-16 DIAGNOSIS — R059 Cough, unspecified: Secondary | ICD-10-CM | POA: Diagnosis present

## 2024-03-16 LAB — COMPREHENSIVE METABOLIC PANEL WITH GFR
ALT: 42 U/L (ref 0–44)
AST: 56 U/L — ABNORMAL HIGH (ref 15–41)
Albumin: 4.1 g/dL (ref 3.5–5.0)
Alkaline Phosphatase: 44 U/L (ref 38–126)
Anion gap: 13 (ref 5–15)
BUN: 7 mg/dL (ref 6–20)
CO2: 25 mmol/L (ref 22–32)
Calcium: 9.2 mg/dL (ref 8.9–10.3)
Chloride: 99 mmol/L (ref 98–111)
Creatinine, Ser: 0.66 mg/dL (ref 0.61–1.24)
GFR, Estimated: >60 mL/min
Glucose, Bld: 113 mg/dL — ABNORMAL HIGH (ref 70–99)
Potassium: 3.4 mmol/L — ABNORMAL LOW (ref 3.5–5.1)
Sodium: 138 mmol/L (ref 135–145)
Total Bilirubin: 0.5 mg/dL (ref 0.0–1.2)
Total Protein: 6.8 g/dL (ref 6.5–8.1)

## 2024-03-16 LAB — BASIC METABOLIC PANEL WITH GFR
Anion gap: 13 (ref 5–15)
BUN: 7 mg/dL (ref 6–20)
CO2: 23 mmol/L (ref 22–32)
Calcium: 9 mg/dL (ref 8.9–10.3)
Chloride: 101 mmol/L (ref 98–111)
Creatinine, Ser: 0.74 mg/dL (ref 0.61–1.24)
GFR, Estimated: >60 mL/min
Glucose, Bld: 125 mg/dL — ABNORMAL HIGH (ref 70–99)
Potassium: 3.8 mmol/L (ref 3.5–5.1)
Sodium: 136 mmol/L (ref 135–145)

## 2024-03-16 LAB — RESP PANEL BY RT-PCR (RSV, FLU A&B, COVID)  RVPGX2
Influenza A by PCR: POSITIVE — AB
Influenza B by PCR: NEGATIVE
Resp Syncytial Virus by PCR: NEGATIVE
SARS Coronavirus 2 by RT PCR: NEGATIVE

## 2024-03-16 LAB — CBC
HCT: 43 % (ref 39.0–52.0)
Hemoglobin: 15 g/dL (ref 13.0–17.0)
MCH: 31.1 pg (ref 26.0–34.0)
MCHC: 34.9 g/dL (ref 30.0–36.0)
MCV: 89.2 fL (ref 80.0–100.0)
Platelets: 92 K/uL — ABNORMAL LOW (ref 150–400)
RBC: 4.82 MIL/uL (ref 4.22–5.81)
RDW: 11.7 % (ref 11.5–15.5)
WBC: 2.9 K/uL — ABNORMAL LOW (ref 4.0–10.5)
nRBC: 0 % (ref 0.0–0.2)

## 2024-03-16 LAB — GROUP A STREP BY PCR: Group A Strep by PCR: NOT DETECTED

## 2024-03-16 LAB — MONONUCLEOSIS SCREEN: Mono Screen: NEGATIVE

## 2024-03-16 LAB — TROPONIN T, HIGH SENSITIVITY: Troponin T High Sensitivity: <15 ng/L (ref 0–19)

## 2024-03-16 MED ORDER — ONDANSETRON 4 MG PO TBDP: 8.0000 mg | ORAL_TABLET | Freq: Once | ORAL | Status: DC

## 2024-03-16 MED ORDER — PROCHLORPERAZINE EDISYLATE 10 MG/2ML IJ SOLN
10.0000 mg | Freq: Once | INTRAMUSCULAR | Status: AC
  Administered 2024-03-16: 10 mg via INTRAVENOUS
  Filled 2024-03-16: qty 2

## 2024-03-16 MED ORDER — ONDANSETRON 4 MG PO TBDP: 4.0000 mg | ORAL_TABLET | Freq: Three times a day (TID) | ORAL | 0 refills | Status: AC | PRN

## 2024-03-16 MED ORDER — AZITHROMYCIN 250 MG PO TABS
500.0000 mg | ORAL_TABLET | Freq: Once | ORAL | Status: AC
  Administered 2024-03-16: 500 mg via ORAL
  Filled 2024-03-16: qty 2

## 2024-03-16 MED ORDER — ACETAMINOPHEN 325 MG PO TABS: 650.0000 mg | ORAL_TABLET | Freq: Once | ORAL | Status: DC | PRN

## 2024-03-16 MED ORDER — BENZONATATE 100 MG PO CAPS
200.0000 mg | ORAL_CAPSULE | Freq: Once | ORAL | Status: AC
  Administered 2024-03-16: 200 mg via ORAL
  Filled 2024-03-16: qty 2

## 2024-03-16 MED ORDER — IBUPROFEN 400 MG PO TABS
600.0000 mg | ORAL_TABLET | Freq: Once | ORAL | Status: AC
  Administered 2024-03-16: 600 mg via ORAL
  Filled 2024-03-16: qty 1

## 2024-03-16 MED ORDER — LACTATED RINGERS IV BOLUS
1000.0000 mL | Freq: Once | INTRAVENOUS | Status: AC
  Administered 2024-03-16: 1000 mL via INTRAVENOUS

## 2024-03-16 MED ORDER — KETOROLAC TROMETHAMINE 15 MG/ML IJ SOLN
15.0000 mg | Freq: Once | INTRAMUSCULAR | Status: AC
  Administered 2024-03-16: 15 mg via INTRAVENOUS
  Filled 2024-03-16: qty 1

## 2024-03-16 MED ORDER — ONDANSETRON HCL 4 MG/2ML IJ SOLN
4.0000 mg | Freq: Once | INTRAMUSCULAR | Status: AC
  Administered 2024-03-16: 4 mg via INTRAVENOUS
  Filled 2024-03-16: qty 2

## 2024-03-16 MED ORDER — AZITHROMYCIN 250 MG PO TABS: 250.0000 mg | ORAL_TABLET | Freq: Every day | ORAL | 0 refills | Status: AC

## 2024-03-16 MED ORDER — LIDOCAINE 5 % EX PTCH: 1.0000 | MEDICATED_PATCH | CUTANEOUS | 0 refills | Status: AC

## 2024-03-16 MED ORDER — LIDOCAINE 5 % EX PTCH
2.0000 | MEDICATED_PATCH | CUTANEOUS | Status: DC
  Administered 2024-03-16: 2 via TRANSDERMAL
  Filled 2024-03-16: qty 2

## 2024-03-16 MED ORDER — AMOXICILLIN-POT CLAVULANATE 875-125 MG PO TABS
1.0000 | ORAL_TABLET | Freq: Once | ORAL | Status: AC
  Administered 2024-03-16: 1 via ORAL
  Filled 2024-03-16: qty 1

## 2024-03-16 MED ORDER — AMOXICILLIN-POT CLAVULANATE 875-125 MG PO TABS: 1.0000 | ORAL_TABLET | Freq: Two times a day (BID) | ORAL | 0 refills | Status: AC

## 2024-03-16 MED ORDER — CYCLOBENZAPRINE HCL 10 MG PO TABS: 5.0000 mg | ORAL_TABLET | Freq: Every evening | ORAL | 0 refills | Status: DC | PRN

## 2024-03-16 MED ORDER — DIPHENHYDRAMINE HCL 50 MG/ML IJ SOLN
12.5000 mg | Freq: Once | INTRAMUSCULAR | Status: AC
  Administered 2024-03-16: 12.5 mg via INTRAVENOUS
  Filled 2024-03-16: qty 1

## 2024-03-16 MED ORDER — BENZONATATE 100 MG PO CAPS: 200.0000 mg | ORAL_CAPSULE | Freq: Three times a day (TID) | ORAL | 0 refills | Status: AC | PRN

## 2024-03-16 NOTE — ED Provider Notes (Addendum)
 "    [facility]    CSN: [id number] Arrival date & time: 03/16/24  1222    HISTORY   Chief Complaint  Patient presents with   Letter for School/Work   Cough   HPI Jerome Collins is a pleasant, 24 y.o. male who presents to urgent care today. Patient reports exposure to influenza.  States began to feel sick 5 days ago, needs a note for work.  Patient states when he woke up this morning he had a temperature of 100.3, headache, body aches and vomiting.  Patient denies cough, nasal congestion, rhinorrhea, sore throat.  The history is provided by the patient.  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 No pertinent surgical history.  Home Medications    Prior to Admission medications  Medication Sig Start Date End Date Taking? Authorizing Provider  meloxicam  (MOBIC ) 15 MG tablet Take 1 tablet (15 mg total) by mouth daily. 05/27/23   McDonald, Juliene SAUNDERS, DPM  ondansetron  (ZOFRAN -ODT) 4 MG disintegrating tablet Take 1 tablet (4 mg total) by mouth every 8 (eight) hours as needed for nausea or vomiting. 05/26/22   Sofia, Leslie K, PA-C  polyethylene glycol powder (MIRALAX ) powder Take 17 g by mouth daily. 06/30/17   Palumbo, April, MD    Family History Family History  Problem Relation Age of Onset   Mental illness Mother        depression, anxiety   Cancer Maternal Grandmother        leukemia, breast   Cancer Paternal Grandfather        prostate   Social History Social History[1] Allergies   Patient has no known  allergies.  Review of Systems Review of Systems  Respiratory:  Positive for cough.    Pertinent findings revealed after performing a 14 point review of systems has been noted in the history of present illness.  Physical Exam Vital Signs BP 106/68 (BP Location: Left Arm)   Pulse 92   Temp 98.4 F (36.9 C) (Oral)   Resp 20   SpO2 97%   No data found.  Physical Exam Vitals and nursing note reviewed.  Constitutional:      Appearance: He is ill-appearing and toxic-appearing.  HENT:     Head: Normocephalic and atraumatic.     Salivary Glands: Right salivary gland is not diffusely enlarged or tender. Left salivary gland is not diffusely enlarged or tender.     Right Ear: Tympanic membrane, ear canal and external ear normal.     Left Ear: Tympanic membrane, ear canal and external ear normal.     Nose: Congestion and rhinorrhea present. Rhinorrhea is clear.     Right Sinus: No maxillary sinus tenderness or frontal sinus tenderness.     Left Sinus: No maxillary sinus tenderness.     Mouth/Throat:     Mouth: Mucous membranes are moist.     Pharynx: Pharyngeal swelling, posterior oropharyngeal erythema and uvula  swelling present.     Tonsils: No tonsillar exudate. 0 on the right. 0 on the left.  Cardiovascular:     Rate and Rhythm: Normal rate and regular rhythm.     Pulses: Normal pulses.  Pulmonary:     Effort: Pulmonary effort is normal. No accessory muscle usage, prolonged expiration or respiratory distress.     Breath sounds: No stridor. No wheezing, rhonchi or rales.     Comments: Turbulent breath sounds throughout without wheeze, rale, rhonchi. Abdominal:     General: Abdomen is flat. Bowel sounds are normal.     Palpations: Abdomen is soft.  Musculoskeletal:        General: Normal range of motion.  Lymphadenopathy:     Cervical: Cervical adenopathy present.     Right cervical: Superficial cervical adenopathy and posterior cervical adenopathy present.     Left cervical:  Superficial cervical adenopathy and posterior cervical adenopathy present.  Skin:    General: Skin is warm and dry.  Neurological:     General: No focal deficit present.     Mental Status: He is disoriented and confused.     Motor: Motor function is intact.     Coordination: Coordination is intact.     Gait: Gait is intact.     Deep Tendon Reflexes: Reflexes are normal and symmetric.  Psychiatric:        Attention and Perception: Attention and perception normal.        Mood and Affect: Mood and affect normal.        Speech: Speech normal.        Behavior: Behavior normal. Behavior is cooperative.        Thought Content: Thought content normal.     Visual Acuity Right Eye Distance:   Left Eye Distance:   Bilateral Distance:    Right Eye Near:   Left Eye Near:    Bilateral Near:     UC Couse / Diagnostics / Procedures:     Radiology No results found.  Procedures Procedures (including critical care time) EKG  Pending results:  Labs Reviewed - No data to display  Medications Ordered in UC: Medications - No data to display  UC Diagnoses / Final Clinical Impressions(s)   I have reviewed the triage vital signs and the nursing notes.  Pertinent labs & imaging results that were available during my care of the patient were reviewed by me and considered in my medical decision making (see chart for details).    Final diagnoses:  Exposure to influenza  Influenza A  Leukopenia, unspecified type  Thrombocytopenia   Patient advised flu test performed at the emergency department was positive.  Due to duration of symptoms, patient can no longer benefit from Tamiflu.  Supportive medications discussed.  Conservative care recommended.  Return precautions advised.  Note provided for work.  Addendum: Upon further review of patient's lab results from his visit to the ED 4 hours ago, patient has significantly low white blood cell count and reduced platelets.  Patient's mother  contacted by phone and advised to take him to the emergency room now for further evaluation.  She agreed to take patient to the [facility] now.  Please see discharge instructions below for details of plan of care as provided to patient. ED Prescriptions   None    PDMP not reviewed this encounter.    Discharge Instructions      Your rapid influenza antigen test today was positive.  Due to the duration of  your symptoms, you would no longer benefit from antiviral therapy for influenza.     Conservative care is recommended with rest, drinking plenty of clear fluids, eating only when hungry, taking supportive medications for your symptoms and avoiding being around other people.  Please remain at home until you are fever free for 24 hours without the use of antifever medications such as Tylenol  and ibuprofen.   Please read below to learn more about the medications, dosages and frequencies that I recommend to help alleviate your symptoms and to get you feeling better soon:   Advil, Motrin (ibuprofen): This is a good anti-inflammatory medication which addresses aches, pains and inflammation of the upper airways that causes sinus and nasal congestion as well as in the lower airways which makes your cough feel tight and sometimes burn.  I recommend that you take  400 mg every 6-8 hours as needed.  Please do not take more than 2400 mg of ibuprofen in a 24-hour period and please do not take high doses of ibuprofen for more than 3 days in a row as this can lead to stomach ulcers.   DayQuil Severe Cold and Flu Liquid / Mucinex Max Strength Cold and Flu Liquidgels: These are identical over-the-counter multisymptom medications that contain a fever/pain reliever, acetaminophen  650 mg, a cough suppressant, dextromethorphan 20 mg, an expectorant which loosens congestion to make coughing easier, guaifenesin 400 mg, and a decongestant intended to stop mucous production, phenylephrine 10 mg.  Please keep  in mind that the FDA recently stated that phenylephrine is ineffective.  My personal recommendation is to avoid combination medications such as this one and to instead choose your own combination of the single symptom relievers listed in this summary.  Please be careful when taking this medication along with other medications containing acetaminophen  or Tylenol .  The maximum dose of Tylenol  in a 24-hour period is 3000 mg, taking more than 3000 mg can damage your liver.   NyQuil Severe Cold and Flu Liquid, NyQuil VapoCOOL Caplets / Tylenol  Cold + Flu + Cough Nighttime Liquid: These are identical over-the-counter multisymptom medications that contain a fever/pain reliever, acetaminophen  650 mg, a cough suppressant, dextromethorphan 30 mg, and an antihistamine commonly found and sleep medications such as Unisom and Sleep-Aid that dries out mucus production and helps you sleep, doxylamine 12.5 mg.  Please do not take this medication along with other cough suppressants with the letters DM or with other antihistamines such as Benadryl, Zyrtec, Xyzal, Allegra or Claritin to avoid overdose.  Please be careful when taking this medication along with other medications containing acetaminophen  or Tylenol .  The maximum dose of Tylenol  in a 24-hour period is 3000 mg, taking more than 3000 mg can damage your liver.   If symptoms have not meaningfully improved in the next 5 to 7 days, please return for repeat evaluation or follow-up with your regular provider.  If symptoms have worsened in the next 3 to 5 days, please go to the emergency room for further evaluation.    Thank you for visiting urgent care today.  We appreciate the opportunity to participate in your care.       Disposition Upon Discharge:  Condition: stable for discharge home  Patient presented with an acute illness with associated systemic symptoms and significant discomfort requiring urgent management. In my opinion, this is a condition that a  prudent lay person (someone who possesses an average knowledge of health and medicine) may potentially expect to result in complications if not addressed urgently  such as respiratory distress, impairment of bodily function or dysfunction of bodily organs.   Routine symptom specific, illness specific and/or disease specific instructions were discussed with the patient and/or caregiver at length.   As such, the patient has been evaluated and assessed, work-up was performed and treatment was provided in alignment with urgent care protocols and evidence based medicine.  Patient/parent/caregiver has been advised that the patient may require follow up for further testing and treatment if the symptoms continue in spite of treatment, as clinically indicated and appropriate.  Patient/parent/caregiver has been advised to return to the [facility] or PCP if no better; to PCP or the Emergency Department if new signs and symptoms develop, or if the current signs or symptoms continue to change or worsen for further workup, evaluation and treatment as clinically indicated and appropriate  The patient will follow up with their current PCP if and as advised. If the patient does not currently have a PCP we will assist them in obtaining one.   The patient may need specialty follow up if the symptoms continue, in spite of conservative treatment and management, for further workup, evaluation, consultation and treatment as clinically indicated and appropriate.  Patient/parent/caregiver verbalized understanding and agreement of plan as discussed.  All questions were addressed during visit.  Please see discharge instructions below for further details of plan.  This office note has been dictated using Teaching laboratory technician.  Unfortunately, this method of dictation can sometimes lead to typographical or grammatical errors.  I apologize for your inconvenience in advance if this occurs.  Please do not hesitate to reach out  to me if clarification is needed.      Joesph Shaver Scales, PA-C 03/16/24 1414     [1]  Social History Tobacco Use   Smoking status: Some Days   Smokeless tobacco: Never   Tobacco comments:    vapes - no nicotine - every other day  Vaping Use   Vaping status: Some Days  Substance Use Topics   Alcohol use: Yes    Comment: mikes hard lemonade w/ shot of vodka   Drug use: Not Currently    Types: Marijuana    Comment: LSD     Joesph Shaver Scales, PA-C 03/16/24 1429  "

## 2024-03-16 NOTE — ED Triage Notes (Signed)
 Pt reports taking  severe cold and flu at 0600 on 1/1 Ibprophen at 2330 on 12/31 Mucinex at 2200 on 12/31

## 2024-03-16 NOTE — ED Triage Notes (Signed)
 Pt/wife stated, On Friday with nose running, and went to worse with body aches with N/V and feeling delirious and hot and cold all the time.

## 2024-03-16 NOTE — ED Provider Notes (Signed)
 " [place] EMERGENCY DEPARTMENT AT [facility] Provider Note   CSN: [id number] Arrival date & time: 03/16/24  1437     Patient presents with: Abnormal Labs   Jerome Collins is a 24 y.o. male with a significant past medical history presents to the ER at the recommendation of urgent care.  He states he is at urgent care earlier today for concern of exposure to the flu.  He began to feel sick 5 days ago with some bodyaches, headache, and temperature of 100.3 at home.  He reports that his cough has seemed to worsen and is productive.  He reports he also had an episode of vomiting earlier today.  He tested positive for influenza A at urgent care earlier today.  They also took labs and he was found to have a neutropenia with white blood cells at 2.9 and platelets at 92.  He was told to come to the ER for further evaluation regarding these abnormal lab results.   HPI     Prior to Admission medications  Medication Sig Start Date End Date Taking? Authorizing Provider  amoxicillin -clavulanate (AUGMENTIN ) 875-125 MG tablet Take 1 tablet by mouth 2 (two) times daily. 03/17/24  Yes Veta Palma, PA-C  azithromycin  (ZITHROMAX ) 250 MG tablet Take 1 tablet (250 mg total) by mouth daily. Take first 2 tablets together, then 1 every day until finished. 03/17/24  Yes Veta Palma, PA-C  benzonatate  (TESSALON ) 100 MG capsule Take 2 capsules (200 mg total) by mouth every 8 (eight) hours as needed for cough. 03/16/24  Yes Veta Palma, PA-C  cyclobenzaprine  (FLEXERIL ) 10 MG tablet Take 0.5-1 tablets (5-10 mg total) by mouth at bedtime as needed (muscle pain). 03/16/24  Yes Veta Palma, PA-C  lidocaine  (LIDODERM ) 5 % Place 1-2 patches onto the skin daily. Remove & Discard patch within 12 hours or as directed by MD 03/16/24  Yes Veta Palma, PA-C  ondansetron  (ZOFRAN -ODT) 4 MG disintegrating tablet Take 1 tablet (4 mg total) by mouth every 8 (eight) hours as needed for nausea or vomiting.  03/16/24  Yes Veta Palma, PA-C  meloxicam  (MOBIC ) 15 MG tablet Take 1 tablet (15 mg total) by mouth daily. 05/27/23   McDonald, Juliene SAUNDERS, DPM  polyethylene glycol powder (MIRALAX ) powder Take 17 g by mouth daily. 06/30/17   Palumbo, April, MD    Allergies: Patient has no known allergies.    Review of Systems  Respiratory:  Positive for cough.     Updated Vital Signs BP 119/64 (BP Location: Right Arm)   Pulse 98   Temp 98.1 F (36.7 C) (Oral)   Resp 18   SpO2 98%   Physical Exam Vitals and nursing note reviewed.  Constitutional:      General: He is not in acute distress.    Appearance: He is well-developed. He is ill-appearing.  HENT:     Head: Normocephalic and atraumatic.     Mouth/Throat:     Pharynx: Posterior oropharyngeal erythema present. No oropharyngeal exudate.     Comments: Mild erythema of the posterior pharynx.  No tonsillar exudate.  Swallowing without difficulty.  Uvula midline.  No trismus. Eyes:     Conjunctiva/sclera: Conjunctivae normal.  Cardiovascular:     Rate and Rhythm: Normal rate and regular rhythm.     Heart sounds: No murmur heard. Pulmonary:     Effort: Pulmonary effort is normal. No respiratory distress.     Breath sounds: Rhonchi present.     Comments: Persistent coughing, he is coughing up  white appearing sputum Abdominal:     Palpations: Abdomen is soft.     Tenderness: There is no abdominal tenderness.  Musculoskeletal:        General: No swelling.     Cervical back: Normal range of motion and neck supple.  Skin:    General: Skin is warm and dry.     Capillary Refill: Capillary refill takes less than 2 seconds.     Findings: No rash.  Neurological:     Mental Status: He is alert.  Psychiatric:        Mood and Affect: Mood normal.     (all labs ordered are listed, but only abnormal results are displayed) Labs Reviewed  COMPREHENSIVE METABOLIC PANEL WITH GFR - Abnormal; Notable for the following components:      Result Value    Potassium 3.4 (*)    Glucose, Bld 113 (*)    AST 56 (*)    All other components within normal limits  GROUP A STREP BY PCR  MONONUCLEOSIS SCREEN    EKG: None  Radiology: DG Chest 2 View Result Date: 03/16/2024 EXAM: 2 VIEW(S) XRAY OF THE CHEST 03/16/2024 03:20:21 PM COMPARISON: 09/29/2017 CLINICAL HISTORY: cough FINDINGS: LUNGS AND PLEURA: Left lower lung zone airspace opacity, likely pneumonia. Differential considerations include atelectasis, pulmonary edema, and neoplasm. No pleural effusion. No pneumothorax. HEART AND MEDIASTINUM: No acute abnormality of the cardiac and mediastinal silhouettes. BONES AND SOFT TISSUES: No acute osseous abnormality. IMPRESSION: 1. Left lower lung zone airspace opacity most consistent with pneumonia. 2. Follow-up chest radiograph in 6-8 weeks to document resolution if symptoms persist or if there are risk factors for an underlying lesion. Electronically signed by: Waddell Calk MD 03/16/2024 03:29 PM EST RP Workstation: [id number]     Procedures   Medications Ordered in the ED  lidocaine  (LIDODERM ) 5 % 2 patch (2 patches Transdermal Patch Applied 03/16/24 1749)  lactated ringers  bolus 1,000 mL (0 mLs Intravenous Stopped 03/16/24 1736)  ondansetron  (ZOFRAN ) injection 4 mg (4 mg Intravenous Given 03/16/24 1538)  benzonatate  (TESSALON ) capsule 200 mg (200 mg Oral Given 03/16/24 1538)  ketorolac  (TORADOL ) 15 MG/ML injection 15 mg (15 mg Intravenous Given 03/16/24 1537)  amoxicillin -clavulanate (AUGMENTIN ) 875-125 MG per tablet 1 tablet (1 tablet Oral Given 03/16/24 1637)  azithromycin  (ZITHROMAX ) tablet 500 mg (500 mg Oral Given 03/16/24 1637)  prochlorperazine  (COMPAZINE ) injection 10 mg (10 mg Intravenous Given 03/16/24 1737)  diphenhydrAMINE  (BENADRYL ) injection 12.5 mg (12.5 mg Intravenous Given 03/16/24 1737)    Clinical Course as of 03/16/24 1759  Thu Mar 16, 2024  1542 Re-evaluated patient, he had an episode of emesis. Will give zofran  and re-evaluate/PO challenge  after [AF]  1733 Patient evaluated, feeling improved.  He has been able to tolerate p.o. intake. [AF]    Clinical Course User Index [AF] Veta Palma, PA-C                                 Medical Decision Making Amount and/or Complexity of Data Reviewed Labs: ordered. Radiology: ordered.  Risk Prescription drug management.     Differential diagnosis includes but is not limited to COVID, flu, RSV, viral URI, strep pharyngitis, viral pharyngitis, allergic rhinitis, pneumonia, bronchitis   ED Course:  Upon initial evaluation, patient is ill-appearing, but no acute distress.  Is having a persistent productive cough while I talked him in the room.  Rhonchi bilaterally on lung auscultation.  Reports he had  an episode of vomiting earlier, but not currently feeling nauseous.  Abdomen is soft and nontender.  He is reporting some pain in his rib cage from coughing. No concern for ACS or cardiac pathology given EKG and troponin obtained earlier today within normal limits  Labs Ordered: I Ordered, and personally interpreted labs.  The pertinent results include:   Influenza A positive from test at urgent care earlier today. Covid and RSV negative  Strep negative Mono negative  Imaging Studies ordered: I ordered imaging studies including chest x-ray I independently visualized the imaging with scope of interpretation limited to determining acute life threatening conditions related to emergency care. Imaging showed  IMPRESSION:  1. Left lower lung zone airspace opacity most consistent with pneumonia.  2. Follow-up chest radiograph in 6-8 weeks to document resolution if symptoms  persist or if there are risk factors for an underlying lesion.   I agree with the radiologist interpretation   Cardiac Monitoring: / EKG: The patient was maintained on a cardiac monitor.  I personally viewed and interpreted the cardiac monitored which showed an underlying rhythm of: Normal sinus  rhythm   Medications Given: Augmentin  Azithromycin  Tessalon  Perles Zofran  Toradol  LR bolus Compazine  Benadryl  Lidocaine  patches  Upon re-evaluation, patient remains well appearing reports improvement in his nausea.  He was p.o. challenged with fluids and was also able to keep down the azithromycin  and Augmentin  tablets that were given to him. Patient tested positive for Influenza A at urgent care which would explain patient's symptoms. Covid and RSV negative. Chest x-ray also showing a left lower lobe consolidation concerning for pneumonia. Will treat with Augmentin  and Azithromycin . Patient does smoke, discussed that he will need a follow up chest x-ray in 6-8 weeks to ensure resolution of findings and to exclude underlying lesion. I did review patient's CBC that was obtained earlier urgent care.  His platelets were low at 92, and also with neutropenia at 2.6.  Discussed with patient that he needs to have these repeated with his PCP within the next month to ensure they get back up to normal levels.  However, no current hemoptysis, bruising, or other concerning findings. Patient without significant co-morbidities, no significant vital sign abnormalities, tolerating PO intake, do not feel patient needs any admission for treatment or observation. Patient stable and appropriate for discharge home at this time.   Impression: Influenza A Pneumonia left lower lobe  Disposition:  Discharged home with instructions to use over-the-counter medications as needed for symptom control. Course of Augmentin  and Azithromycin  as prescribed.  May use lidocaine  patches, Tylenol , ibuprofen , and Flexeril  for his chest wall pain.  Tessalon  Perles for cough as needed.  Follow-up with PCP if symptoms not improving within the next 5 days.  He understands he needs repeat labs and chest x-ray in 1 month to ensure resolution of abnormalities and pneumonia.  Work/school note provided. Return precautions given and patient  verbalized understanding.    Record Review: External records from outside source obtained and reviewed including urgent care visit note and CBC, BMP, and respiratory panel obtained earlier today.  Troponin from earlier today within normal limits     This chart was dictated using voice recognition software, Dragon. Despite the best efforts of this provider to proofread and correct errors, errors may still occur which can change documentation meaning.       Final diagnoses:  Pneumonia of left lower lobe due to infectious organism  Influenza A    ED Discharge Orders  Ordered    amoxicillin -clavulanate (AUGMENTIN ) 875-125 MG tablet  2 times daily        03/16/24 1742    azithromycin  (ZITHROMAX ) 250 MG tablet  Daily        03/16/24 1742    benzonatate  (TESSALON ) 100 MG capsule  Every 8 hours PRN        03/16/24 1744    ondansetron  (ZOFRAN -ODT) 4 MG disintegrating tablet  Every 8 hours PRN        03/16/24 1744    cyclobenzaprine  (FLEXERIL ) 10 MG tablet  At bedtime PRN        03/16/24 1744    lidocaine  (LIDODERM ) 5 %  Every 24 hours        03/16/24 1744               Veta Palma, PA-C 03/16/24 1759    Doretha Folks, MD 03/21/24 1512  "

## 2024-03-16 NOTE — Discharge Instructions (Addendum)
 Your chest x-ray showed that you have a pneumonia.   You have been prescribed an antibiotic called Augmentin to treat your pneumonia. Take this antibiotic 2 times a day for the next 5  days.  You were given your first dose here today.  Take your next dose tomorrow morning.  Take the full course of your antibiotic even if you start feeling better. Antibiotics may cause you to have diarrhea.   You have been prescribed an antibiotic called Azithromycin to treat your pneumonia. Take this antibiotic once daily for the next 5 days. You were given your first dose here today, take your next dose tomorrow. Take the full course of your antibiotic even if you start feeling better. Antibiotics may cause you to have diarrhea.  You also tested positive for the flu.  Your symptoms should start to improve over the next 5 days.  Home care instructions:  You may take up to 1000mg  of tylenol  every 6 hours as needed for pain. Do not take more then 4g per day.   You may use up to 600mg  ibuprofen every 6 hours as needed for pain.  Do not exceed 2.4g of ibuprofen per day. You were given a dose here today, your next dose can at 9pm tonight.    For cough: honey 1/2 to 1 teaspoon (you can dilute the honey in water or another fluid).  You can also use guaifenesin and dextromethorphan for cough which are over-the-counter medications. You can use a humidifier for chest congestion and cough.  If you don't have a humidifier, you can sit in the bathroom with the hot shower running.     You have been prescribed a cough medicine called tessalon perles. You were given a dose here, next dose can be at 11pm.   You have been prescribed a muscle relaxer called Flexeril (cyclobenzaprine). You may take 0.5 - 1 tablet (5-10mg ) before bed as needed for muscle pain. This medication can be sedating. Do not drive or operate heavy machinery after taking this medicine. Do not drink alcohol or take other sedating medications when taking this  medicine for safety reasons.  Keep this out of reach of small children.    You been prescribed lidocaine  patches to use on your chest wall as needed for pain. You may leave a patch on for up to 12 hours at a time, the you then must remove the patch for full 12 hours before reapplying a new patch.   For sore throat: try warm salt water gargles, cepacol lozenges, throat spray, warm tea or water with lemon/honey, popsicles or ice, or OTC cold relief medicine for throat discomfort.    For congestion: Flonase (Fluticasone) 1-2 sprays in each nostril daily. This is an over the counter medication.    It is important to stay hydrated: drink plenty of fluids (water, gatorade/powerade/pedialyte, juices, or teas) to keep your throat moisturized and help further relieve irritation/discomfort.   Take basic precautions such as washing your hands often, covering your mouth when you cough or sneeze, and avoiding public places where you could spread your illness to others.   Follow-up instructions: Please follow-up with your primary care provider for further evaluation of your symptoms if you are not feeling better within the next 5 days.   You had low platelets noted on your blood work earlier today.  This typically is from your body fighting off infection.  Please have this repeated within the next month to ensure your platelets come back up to  a normal level.  Is also recommended that you obtain a repeat chest x-ray in 6 to 8 weeks to ensure resolution of your pneumonia and to evaluate for any potential underlying nodules given your smoking history  Return instructions:  Please return to the Emergency Department if you experience worsening symptoms.  RETURN IMMEDIATELY IF you develop shortness of breath, confusion or altered mental status, a new rash, become dizzy, faint, or poorly responsive, or are unable to be cared for at home. Please return if you have persistent vomiting and cannot keep down fluids or  develop a fever that is not controlled by tylenol  or motrin.   Please return if you have any other emergent concerns.

## 2024-03-16 NOTE — ED Provider Triage Note (Signed)
 Emergency Medicine Provider Triage Evaluation Note  Traye Bates , a 24 y.o. male  was evaluated in triage.  Pt complains of cough, body aches, congestion, abdominal pain, nausea.  Patient states symptoms have been going on for now 6 days.  Does not feel like he is getting better.  Review of Systems  Positive: Body aches cough Negative: Rash  Physical Exam  BP 118/78 (BP Location: Left Arm)   Pulse (!) 101   Temp 100.3 F (37.9 C) (Oral)   Resp 17   Ht 1.727 m (5' 8)   Wt 63.5 kg   SpO2 100%   BMI 21.29 kg/m  Gen:   Awake, appears uncomfortable Resp:  Normal effort  MSK:   Moves extremities without difficulty  Other:    Medical Decision Making  Medically screening exam initiated at 10:25 AM.  Appropriate orders placed.  Georgio Hattabaugh was informed that the remainder of the evaluation will be completed by another provider, this initial triage assessment does not replace that evaluation, and the importance of remaining in the ED until their evaluation is complete.     Randol Simmonds, MD 03/16/24 1028

## 2024-03-16 NOTE — ED Triage Notes (Signed)
 WBC 2.9 and Platelets 92

## 2024-03-16 NOTE — ED Notes (Signed)
 Reviewed discharge instructions, medications, and home care with pt. Pt verbalized understanding and had no further questions. Pt exited ED without complications.

## 2024-03-16 NOTE — ED Triage Notes (Signed)
 Pt reporting stabbing chest pain . Pt has a cough . Pt reporting fever and chills. Pt reporting fever and chills. Pt reporting nausea and vomiting

## 2024-03-16 NOTE — ED Triage Notes (Signed)
 Patient arrives from urgent care with low WBC and platelets and was told to come here for further evaluation. He did test positive for the flu.

## 2024-03-16 NOTE — ED Triage Notes (Signed)
 Patient initially went to the ED. Told there was a 15 hour wait in ED.   Patient requesting a work note.   Symptoms started on Saturday.  This morning temp was 100.3.  headache and body aches.  Patient vomited this morning.  Hot and cold. Vomited ibuprofen in ED.  Patient did get acetaminophen  and has held those down  Sibling has been diagnosed with flu

## 2024-03-16 NOTE — Discharge Instructions (Addendum)
 Your rapid influenza antigen test today was positive.  Due to the duration of your symptoms, you would no longer benefit from antiviral therapy for influenza.     Conservative care is recommended with rest, drinking plenty of clear fluids, eating only when hungry, taking supportive medications for your symptoms and avoiding being around other people.  Please remain at home until you are fever free for 24 hours without the use of antifever medications such as Tylenol  and ibuprofen.   Please read below to learn more about the medications, dosages and frequencies that I recommend to help alleviate your symptoms and to get you feeling better soon:   Advil, Motrin (ibuprofen): This is a good anti-inflammatory medication which addresses aches, pains and inflammation of the upper airways that causes sinus and nasal congestion as well as in the lower airways which makes your cough feel tight and sometimes burn.  I recommend that you take  400 mg every 6-8 hours as needed.  Please do not take more than 2400 mg of ibuprofen in a 24-hour period and please do not take high doses of ibuprofen for more than 3 days in a row as this can lead to stomach ulcers.   DayQuil Severe Cold and Flu Liquid / Mucinex Max Strength Cold and Flu Liquidgels: These are identical over-the-counter multisymptom medications that contain a fever/pain reliever, acetaminophen  650 mg, a cough suppressant, dextromethorphan 20 mg, an expectorant which loosens congestion to make coughing easier, guaifenesin 400 mg, and a decongestant intended to stop mucous production, phenylephrine 10 mg.  Please keep in mind that the FDA recently stated that phenylephrine is ineffective.  My personal recommendation is to avoid combination medications such as this one and to instead choose your own combination of the single symptom relievers listed in this summary.  Please be careful when taking this medication along with other medications containing acetaminophen   or Tylenol .  The maximum dose of Tylenol  in a 24-hour period is 3000 mg, taking more than 3000 mg can damage your liver.   NyQuil Severe Cold and Flu Liquid, NyQuil VapoCOOL Caplets / Tylenol  Cold + Flu + Cough Nighttime Liquid: These are identical over-the-counter multisymptom medications that contain a fever/pain reliever, acetaminophen  650 mg, a cough suppressant, dextromethorphan 30 mg, and an antihistamine commonly found and sleep medications such as Unisom and Sleep-Aid that dries out mucus production and helps you sleep, doxylamine 12.5 mg.  Please do not take this medication along with other cough suppressants with the letters DM or with other antihistamines such as Benadryl, Zyrtec, Xyzal, Allegra or Claritin to avoid overdose.  Please be careful when taking this medication along with other medications containing acetaminophen  or Tylenol .  The maximum dose of Tylenol  in a 24-hour period is 3000 mg, taking more than 3000 mg can damage your liver.   If symptoms have not meaningfully improved in the next 5 to 7 days, please return for repeat evaluation or follow-up with your regular provider.  If symptoms have worsened in the next 3 to 5 days, please go to the emergency room for further evaluation.    Thank you for visiting urgent care today.  We appreciate the opportunity to participate in your care.

## 2024-03-31 ENCOUNTER — Other Ambulatory Visit: Payer: Self-pay

## 2024-03-31 ENCOUNTER — Emergency Department (HOSPITAL_BASED_OUTPATIENT_CLINIC_OR_DEPARTMENT_OTHER)

## 2024-03-31 ENCOUNTER — Emergency Department (HOSPITAL_BASED_OUTPATIENT_CLINIC_OR_DEPARTMENT_OTHER)
Admission: EM | Admit: 2024-03-31 | Discharge: 2024-03-31 | Disposition: A | Discharge status: Home / Self Care | Attending: Emergency Medicine | Admitting: Emergency Medicine

## 2024-03-31 DIAGNOSIS — J189 Pneumonia, unspecified organism: Secondary | ICD-10-CM | POA: Diagnosis not present

## 2024-03-31 DIAGNOSIS — J90 Pleural effusion, not elsewhere classified: Secondary | ICD-10-CM | POA: Insufficient documentation

## 2024-03-31 DIAGNOSIS — R0789 Other chest pain: Secondary | ICD-10-CM

## 2024-03-31 DIAGNOSIS — E878 Other disorders of electrolyte and fluid balance, not elsewhere classified: Secondary | ICD-10-CM | POA: Diagnosis not present

## 2024-03-31 DIAGNOSIS — Z79899 Other long term (current) drug therapy: Secondary | ICD-10-CM | POA: Diagnosis not present

## 2024-03-31 LAB — CBC WITH DIFFERENTIAL/PLATELET
Abs Immature Granulocytes: 0.04 K/uL (ref 0.00–0.07)
Basophils Absolute: 0 K/uL (ref 0.0–0.1)
Basophils Relative: 1 %
Eosinophils Absolute: 0.1 K/uL (ref 0.0–0.5)
Eosinophils Relative: 1 %
HCT: 39.4 % (ref 39.0–52.0)
Hemoglobin: 13.5 g/dL (ref 13.0–17.0)
Immature Granulocytes: 1 %
Lymphocytes Relative: 19 %
Lymphs Abs: 1.2 K/uL (ref 0.7–4.0)
MCH: 30.8 pg (ref 26.0–34.0)
MCHC: 34.3 g/dL (ref 30.0–36.0)
MCV: 90 fL (ref 80.0–100.0)
Monocytes Absolute: 0.9 K/uL (ref 0.1–1.0)
Monocytes Relative: 13 %
Neutro Abs: 4.4 K/uL (ref 1.7–7.7)
Neutrophils Relative %: 65 %
Platelets: 176 K/uL (ref 150–400)
RBC: 4.38 MIL/uL (ref 4.22–5.81)
RDW: 11.6 % (ref 11.5–15.5)
WBC: 6.7 K/uL (ref 4.0–10.5)
nRBC: 0 % (ref 0.0–0.2)

## 2024-03-31 LAB — COMPREHENSIVE METABOLIC PANEL WITH GFR
ALT: 16 U/L (ref 0–44)
AST: 17 U/L (ref 15–41)
Albumin: 4.3 g/dL (ref 3.5–5.0)
Alkaline Phosphatase: 59 U/L (ref 38–126)
Anion gap: 10 (ref 5–15)
BUN: 13 mg/dL (ref 6–20)
CO2: 29 mmol/L (ref 22–32)
Calcium: 10.2 mg/dL (ref 8.9–10.3)
Chloride: 97 mmol/L — ABNORMAL LOW (ref 98–111)
Creatinine, Ser: 0.66 mg/dL (ref 0.61–1.24)
GFR, Estimated: >60 mL/min
Glucose, Bld: 95 mg/dL (ref 70–99)
Potassium: 4 mmol/L (ref 3.5–5.1)
Sodium: 136 mmol/L (ref 135–145)
Total Bilirubin: 0.7 mg/dL (ref 0.0–1.2)
Total Protein: 7.8 g/dL (ref 6.5–8.1)

## 2024-03-31 LAB — TROPONIN T, HIGH SENSITIVITY: Troponin T High Sensitivity: <15 ng/L (ref 0–19)

## 2024-03-31 MED ORDER — DIAZEPAM 5 MG PO TABS
5.0000 mg | ORAL_TABLET | Freq: Once | ORAL | Status: AC
  Administered 2024-03-31: 5 mg via ORAL
  Filled 2024-03-31: qty 1

## 2024-03-31 MED ORDER — CYCLOBENZAPRINE HCL 10 MG PO TABS: 10.0000 mg | ORAL_TABLET | Freq: Two times a day (BID) | ORAL | 0 refills | Status: AC | PRN

## 2024-03-31 MED ORDER — LIDOCAINE 5 % EX PTCH
2.0000 | MEDICATED_PATCH | Freq: Once | CUTANEOUS | Status: DC
  Administered 2024-03-31: 1 via TRANSDERMAL
  Filled 2024-03-31: qty 2

## 2024-03-31 MED ORDER — IOHEXOL 350 MG/ML SOLN
75.0000 mL | Freq: Once | INTRAVENOUS | Status: AC | PRN
  Administered 2024-03-31: 75 mL via INTRAVENOUS

## 2024-03-31 MED ORDER — MORPHINE SULFATE (PF) 4 MG/ML IV SOLN
4.0000 mg | Freq: Once | INTRAVENOUS | Status: AC
  Administered 2024-03-31: 4 mg via INTRAVENOUS
  Filled 2024-03-31: qty 1

## 2024-03-31 MED ORDER — KETOROLAC TROMETHAMINE 15 MG/ML IJ SOLN
15.0000 mg | Freq: Once | INTRAMUSCULAR | Status: AC
  Administered 2024-03-31: 15 mg via INTRAVENOUS
  Filled 2024-03-31: qty 1

## 2024-03-31 MED ORDER — ACETAMINOPHEN 500 MG PO TABS
1000.0000 mg | ORAL_TABLET | Freq: Once | ORAL | Status: AC
  Administered 2024-03-31: 1000 mg via ORAL
  Filled 2024-03-31: qty 2

## 2024-03-31 NOTE — Discharge Instructions (Addendum)
 Jerome Collins  Thank you for allowing us  to take care of you today.  You came to the Emergency Department today because you are having left-sided chest wall pain after recent diagnosis of pneumonia.  Here in the emergency department we did labs as well as a CT which were reassuring.  Your white blood cell count is normal, therefore you do not have a severe infection, your CT shows that your pneumonia has improved, and we are not seeing any evidence of blood clot in your lung which would be another cause of severe chest pain.  You do have a small amount of fluid between your lung and your chest wall, which is likely from leftover inflammation from your pneumonia, that should improve on its own over time.  Your pain is likely partially due to this fluid around your lung, and also due to pain of the muscles of your chest wall.    We recommend against the tramadol, you should discontinue this medication, instead we will prescribe you a muscle relaxer called Flexeril  (AKA cyclobenzaprine ) that you can use twice a day as needed.  Additionally you can use Tylenol  and ibuprofen  for pain. You can use Tylenol  and ibuprofen  every 4-6 hours as needed for pain control.  You can take these medications together for maximum pain control, or you can alternate between the 2 for having medication more frequently, for example Tylenol  at noon, ibuprofen  at 3 PM, Tylenol  at 6 PM, ibuprofen  at 9 PM, etc.  For adults, the dosing is 600 mg of ibuprofen , and 500 mg of Tylenol .  Please do not exceed 4 g of Tylenol  within 24 hours.  Please do not exceed 3200 mg ibuprofen  24 hours.  You can also purchase lidocaine  patches over-the-counter, you can put these on your chest wall for 12 hours at a time, and this will provide additional pain relief.  Please use the incentive spirometer times an hour while you are awake, this will help open up your lungs, and also help retrain/work out the muscles in the chest wall so they can be stronger  and less less sore.  To-Do: 1. Please follow-up with your primary doctor within 2 days / as soon as possible.   Please return to the Emergency Department or call 911 if you experience have worsening of your symptoms, or do not get better, new or different chest pain, shortness of breath, severe or significantly worsening pain, high fever, severe confusion, pass out or have any reason to think that you need emergency medical care.   We hope you feel better soon.   Mitzie Later, MD Department of Emergency Medicine [facility]

## 2024-03-31 NOTE — ED Triage Notes (Addendum)
 Pt POV with c/o of left sided rib pain. Was seen on 1/06 with pneumonia. Pain came back 4days ago, increased pain last night. Denies NVD.

## 2024-03-31 NOTE — ED Provider Notes (Signed)
 " [facility] AT [facility] Provider Note   CSN: [id number] Arrival date & time: 03/31/24  9297     History No chief complaint on file.   HPI: Jerome Collins is a 24 y.o. male with no pertinent history who presents complaining of chest pain. Patient arrived via POV accompanied by wife.  History provided by patient and spouse/partner.  No interpreter required during this encounter.  Patient reportedly was recently seen in the emergency department at the beginning of this month and was diagnosed with a left-sided pneumonia.  Patient and wife report that he was prescribed amoxicillin  as well as azithromycin , completed the course of amoxicillin , however reports that the azithromycin  made him sick, therefore he did not take the last dose.  Reports that that he has been having worsening left-sided rib pain, worsened by movement, breathing, coughing.  Does endorse productive cough, denies nausea, vomiting, diarrhea.  Reports that pain has been intolerable at home, therefore they came back to the emergency department for reevaluation.  Patient did take a dose of tramadol this morning for pain control without significant improvement.  Patient's recorded medical, surgical, social, medication list and allergies were reviewed in the Snapshot window as part of the initial history.   Prior to Admission medications  Medication Sig Start Date End Date Taking? Authorizing Provider  cyclobenzaprine  (FLEXERIL ) 10 MG tablet Take 1 tablet (10 mg total) by mouth 2 (two) times daily as needed for muscle spasms. 03/31/24  Yes Rogelia Jerilynn RAMAN, MD  amoxicillin -clavulanate (AUGMENTIN ) 875-125 MG tablet Take 1 tablet by mouth 2 (two) times daily. 03/17/24   Veta Palma, PA-C  azithromycin  (ZITHROMAX ) 250 MG tablet Take 1 tablet (250 mg total) by mouth daily. Take first 2 tablets together, then 1 every day until finished. 03/17/24   Veta Palma, PA-C  benzonatate  (TESSALON ) 100 MG  capsule Take 2 capsules (200 mg total) by mouth every 8 (eight) hours as needed for cough. 03/16/24   Veta Palma, PA-C  lidocaine  (LIDODERM ) 5 % Place 1-2 patches onto the skin daily. Remove & Discard patch within 12 hours or as directed by MD 03/16/24   Veta Palma, PA-C  meloxicam  (MOBIC ) 15 MG tablet Take 1 tablet (15 mg total) by mouth daily. 05/27/23   McDonald, Juliene SAUNDERS, DPM  ondansetron  (ZOFRAN -ODT) 4 MG disintegrating tablet Take 1 tablet (4 mg total) by mouth every 8 (eight) hours as needed for nausea or vomiting. 03/16/24   Veta Palma, PA-C  polyethylene glycol powder (MIRALAX ) powder Take 17 g by mouth daily. 06/30/17   Palumbo, April, MD     Allergies: Patient has no known allergies.   Review of Systems   ROS as per HPI  Physical Exam Updated Vital Signs BP 128/74 (BP Location: Right Arm)   Pulse 87   Temp 98.7 F (37.1 C)   Resp 18   Ht 5' 8 (1.727 m)   Wt 63.5 kg   SpO2 98%   BMI 21.29 kg/m  Physical Exam Vitals and nursing note reviewed.  Constitutional:      General: He is not in acute distress.    Appearance: Normal appearance. He is well-developed.     Comments: Tearful, uncomfortable.  HENT:     Head: Normocephalic and atraumatic.  Eyes:     Extraocular Movements: Extraocular movements intact.     Conjunctiva/sclera: Conjunctivae normal.  Cardiovascular:     Rate and Rhythm: Normal rate and regular rhythm.     Pulses: Normal pulses.  Heart sounds: No murmur heard. Pulmonary:     Effort: Pulmonary effort is normal. No respiratory distress.     Breath sounds: Normal breath sounds.  Chest:     Comments: Left-sided chest wall tenderness to palpation Abdominal:     Palpations: Abdomen is soft.     Tenderness: There is no abdominal tenderness.  Musculoskeletal:        General: No swelling.     Cervical back: Neck supple.  Skin:    General: Skin is warm and dry.     Capillary Refill: Capillary refill takes less than 2 seconds.   Neurological:     General: No focal deficit present.     Mental Status: He is alert and oriented to person, place, and time.  Psychiatric:        Mood and Affect: Mood normal.     ED Course/ Medical Decision Making/ A&P    Procedures Procedures   Medications Ordered in ED Medications  lidocaine  (LIDODERM ) 5 % 2 patch (1 patch Transdermal Patch Applied 03/31/24 0753)  acetaminophen  (TYLENOL ) tablet 1,000 mg (1,000 mg Oral Given 03/31/24 0756)  ketorolac  (TORADOL ) 15 MG/ML injection 15 mg (15 mg Intravenous Given 03/31/24 0747)  morphine  (PF) 4 MG/ML injection 4 mg (4 mg Intravenous Given 03/31/24 0748)  diazepam  (VALIUM ) tablet 5 mg (5 mg Oral Given 03/31/24 0852)  iohexol  (OMNIPAQUE ) 350 MG/ML injection 75 mL (75 mLs Intravenous Contrast Given 03/31/24 0843)    Medical Decision Making:   Jerome Collins is a 24 y.o. male who presents for left-sided chest wall pain as per above.  Physical exam is pertinent for left-sided chest wall tenderness to palpation.   The differential includes but is not limited to pneumonia, pleurisy, musculoskeletal pain, fracture, dislocation, failure of antibiotics, pulmonary embolism, ACS, myocarditis.  Independent historian: Spouse/partner  External data reviewed: Labs: reviewed prior labs for baseline, Radiology, and Notes: Reviewed notes, labs, imaging from prior ED visit  Initial Plan:  Screening labs including CBC and Metabolic panel to evaluate for infectious or metabolic etiology of disease.  CT PE study to evaluate for structural/infectious/vascular intra-thoracic pathology.  EKG and single troponin to evaluate for cardiac pathology. Single troponin appropriate due to greater than 6 hours since maximal intensity of symptoms. Objective evaluation as below reviewed   Labs: Ordered, Independent interpretation, and Details: CBC without leukocytosis, anemia, thrombocytopenia.  CMP without AKI, emergent electro gentian, emergent LFT abnormality, mild  hypochloremia to 97, similar to prior absolute value of 99 2 weeks ago.  Troponin undetectable.  Radiology: Ordered, Independent interpretation, Details: Personally viewed CT, do not appreciate large central PE, no focal parenchymal changes, there is a small left-sided effusion, and All images reviewed independently.  Agree with radiology report at this time.   CT Angio Chest PE W and/or Wo Contrast Result Date: 03/31/2024 CLINICAL DATA:  Left-sided rib pain EXAM: CT ANGIOGRAPHY CHEST WITH CONTRAST TECHNIQUE: Multidetector CT imaging of the chest was performed using the standard protocol during bolus administration of intravenous contrast. Multiplanar CT image reconstructions and MIPs were obtained to evaluate the vascular anatomy. RADIATION DOSE REDUCTION: This exam was performed according to the departmental dose-optimization program which includes automated exposure control, adjustment of the mA and/or kV according to patient size and/or use of iterative reconstruction technique. CONTRAST:  75mL OMNIPAQUE  IOHEXOL  350 MG/ML SOLN COMPARISON:  March 16, 2024 FINDINGS: Cardiovascular: Satisfactory opacification of the pulmonary arteries to the segmental level. No evidence of pulmonary embolism. Normal heart size. No pericardial effusion. Mediastinum/Nodes: No  enlarged mediastinal, hilar, or axillary lymph nodes. Thyroid gland, trachea, and esophagus demonstrate no significant findings. Lungs/Pleura: Minimal left pleural effusion is noted with minimal adjacent subsegmental atelectasis. No pneumothorax is noted. Right lung is clear. Upper Abdomen: No acute abnormality. Musculoskeletal: No chest wall abnormality. No acute or significant osseous findings. Review of the MIP images confirms the above findings. IMPRESSION: 1. No definite evidence of pulmonary embolus. 2. Minimal left pleural effusion is noted with minimal adjacent subsegmental atelectasis. Electronically Signed   By: Lynwood Landy Raddle M.D.   On:  03/31/2024 09:01   DG Chest 2 View Result Date: 03/16/2024 EXAM: 2 VIEW(S) XRAY OF THE CHEST 03/16/2024 03:20:21 PM COMPARISON: 09/29/2017 CLINICAL HISTORY: cough FINDINGS: LUNGS AND PLEURA: Left lower lung zone airspace opacity, likely pneumonia. Differential considerations include atelectasis, pulmonary edema, and neoplasm. No pleural effusion. No pneumothorax. HEART AND MEDIASTINUM: No acute abnormality of the cardiac and mediastinal silhouettes. BONES AND SOFT TISSUES: No acute osseous abnormality. IMPRESSION: 1. Left lower lung zone airspace opacity most consistent with pneumonia. 2. Follow-up chest radiograph in 6-8 weeks to document resolution if symptoms persist or if there are risk factors for an underlying lesion. Electronically signed by: Waddell Calk MD 03/16/2024 03:29 PM EST RP Workstation: [id number]    EKG/Medicine tests: Ordered and Independent interpretation EKG Interpretation: Sinus rhythm ST elev, probable normal early repol pattern Baseline wander in lead(s) II V4 Confirmed by Rogelia Satterfield ([id number]) on 03/31/2024 8:29:41 AM                Interventions: Morphine , lidocaine  patch, Toradol , Tylenol   See the EMR for full details regarding lab and imaging results.  Patient presents to the emergency department for left-sided chest pain and associated tenderness to palpation, patient is vitally stable, though is tearful and significantly uncomfortable.  On exam, given patient has left-sided pneumonia and has associated muscular tenderness to palpation, do feel that patient warrants multimodal pain control for possible muscular contribution to pain, however given recent diagnosis of pneumonia, do also feel that patient warrants labs and imaging studies as per initial plan above.  On reevaluation at approximately 830, patient has significant improvement in pain control after lidocaine  patches, Toradol , morphine , Tylenol .  Labs are reassuring, no leukocytosis, thus in commendation with  patient's hemodynamic stability and lack of fever, doubt sepsis.  Patient without emergent electrolyte derangements, troponin undetectable, thus ACS, myocarditis are highly unlikely.  Patient still does have ongoing intermittent pain and spasms, thus will administer p.o. Valium  for additional muscle relaxation, awaiting CT for further evaluation of lung parenchyma as well as for evaluation of pulmonary embolism.  This demonstrates small pleural effusion which is likely residual from recent pneumonia.  Patient with further improvement after diazepam .  Feel that patient's presentation is most consistent with musculoskeletal chest wall pain with component of pleurisy from residual inflammatory pleural effusion, recommended discontinuation of tramadol, initiation of Flexeril  twice daily as needed, as well as scheduled Tylenol , ibuprofen , and OTC lidocaine  patches.  Patient and wife comfortable this plan, discharged in stable condition.  Presentation is most consistent with acute uncomplicated illness, Presentation is most consistent with acute complicated illness, Current presentation is complicated by underlying chronic conditions, and I did consider and rule out acute life/limb-threatening illness  Discussion of management or test interpretations with external provider(s): Not indicated  Risk Drugs:OTC drugs, Prescription drug management, and Parenteral controlled substances  Disposition: DISCHARGE: I believe that the patient is safe for discharge home with outpatient follow-up. Patient was informed of all pertinent  physical exam, laboratory, and imaging findings. Patient's suspected etiology of their symptom presentation was discussed with the patient and all questions were answered. We discussed following up with PCP. I provided thorough ED return precautions. The patient feels safe and comfortable with this plan.  MDM generated using voice dictation software and may contain dictation errors.  Please  contact me for any clarification or with any questions.  Clinical Impression:  1. Left-sided chest wall pain   2. Pleural effusion      Discharge   Final Clinical Impression(s) / ED Diagnoses Final diagnoses:  Left-sided chest wall pain  Pleural effusion    Rx / DC Orders ED Discharge Orders          Ordered    cyclobenzaprine  (FLEXERIL ) 10 MG tablet  2 times daily PRN        03/31/24 0915             Rogelia Jerilynn RAMAN, MD 03/31/24 [phone]  "
